# Patient Record
Sex: Female | Born: 1985 | Race: Black or African American | Hispanic: No | Marital: Single | Smoking: Never smoker
Health system: Southern US, Community
[De-identification: ages and names within clinical notes are randomized; demographics above are authoritative.]

## PROBLEM LIST (undated history)

## (undated) DIAGNOSIS — F32A Depression, unspecified: Secondary | ICD-10-CM

## (undated) DIAGNOSIS — F419 Anxiety disorder, unspecified: Secondary | ICD-10-CM

## (undated) HISTORY — DX: Anxiety disorder, unspecified: F41.9

## (undated) HISTORY — DX: Depression, unspecified: F32.A

---

## 2020-06-16 ENCOUNTER — Encounter: Payer: Self-pay | Admitting: Internal Medicine

## 2020-06-25 ENCOUNTER — Encounter: Payer: Self-pay | Admitting: Internal Medicine

## 2020-06-28 ENCOUNTER — Ambulatory Visit (INDEPENDENT_AMBULATORY_CARE_PROVIDER_SITE_OTHER): Payer: Medicaid Other | Admitting: *Deleted

## 2020-06-28 ENCOUNTER — Other Ambulatory Visit: Payer: Self-pay

## 2020-06-28 VITALS — BP 112/72 | HR 91 | Temp 97.8°F | Ht 59.0 in | Wt 112.4 lb

## 2020-06-28 DIAGNOSIS — Z3201 Encounter for pregnancy test, result positive: Secondary | ICD-10-CM

## 2020-06-28 DIAGNOSIS — O34219 Maternal care for unspecified type scar from previous cesarean delivery: Secondary | ICD-10-CM

## 2020-06-28 DIAGNOSIS — O0990 Supervision of high risk pregnancy, unspecified, unspecified trimester: Secondary | ICD-10-CM

## 2020-06-28 DIAGNOSIS — Z8751 Personal history of pre-term labor: Secondary | ICD-10-CM

## 2020-06-28 DIAGNOSIS — Z8759 Personal history of other complications of pregnancy, childbirth and the puerperium: Secondary | ICD-10-CM

## 2020-06-28 LAB — POCT URINE PREGNANCY: Preg Test, Ur: POSITIVE — AB

## 2020-06-28 MED ORDER — GOJJI WEIGHT SCALE MISC: 1.0000 | Freq: Every day | 0 refills | Status: DC | PRN

## 2020-06-28 MED ORDER — BLOOD PRESSURE MONITOR AUTOMAT DEVI
1.0000 | Freq: Every day | 0 refills | Status: DC
Start: 2020-06-28 — End: 2020-11-01

## 2020-06-28 NOTE — Progress Notes (Signed)
   Location: [facility] Patient: clinic Provider: clinic  PRENATAL INTAKE SUMMARY  Ms. Sui presents today New OB Nurse Interview.  OB History    Gravida  4   Para  3   Term      Preterm  2   AB  0   Living  1     SAB  0   IAB      Ectopic      Multiple      Live Births  1          I have reviewed the patient's medical, obstetrical, social, and family histories, medications, and available lab results.  SUBJECTIVE She has no unusual complaints.  Patient has history of stillbirth via vaginal delivery.  History of preterm delivery @ [redacted] weeks gestation via cesarean.  OBJECTIVE Initial Nurse interview for history/labs (New OB).   Late to Care  EDD: 12/14/2020 by LMP GA: [redacted]w[redacted]d [id number] FHT: 150  GENERAL APPEARANCE: alert, well appearing, in no apparent distress, oriented to person, place and time   ASSESSMENT Positive UPT Normal pregnancy  PLAN Prenatal care: [facility]  OB Pnl/HIV/Hep C OB Urine Culture GC/CT/PAP at next visit with Dr. Debroah Loop HgbEval/SMA/CF ([facility]) APF Panorama A1C Rx for blood pressure monitor and weight scale sent to [facility] Patient to sign up for Babyscripts   Follow Up Instructions:   I discussed the assessment and treatment plan with the patient. The patient was provided an opportunity to ask questions and all were answered. The patient agreed with the plan and demonstrated an understanding of the instructions.   The patient was advised to call back or seek an in-person evaluation if the symptoms worsen or if the condition fails to improve as anticipated.  I provided 40 minutes of  face-to-face time during this encounter.  Clovis Pu, RN

## 2020-06-28 NOTE — Patient Instructions (Addendum)
Genetic Screening Results Information: You are having genetic testing called Panorama today.  It will take approximately 2 weeks before the results are available.  To get your results, you need Internet access to a web browser to search [place]/MyChart (the direct app on your phone will not give you these results).  Then select Lab Scanned and click on the blue hyper link that says View Image to see your Panorama results.  You can also use the directions on the purple card given to look up your results directly on the [place] website.  Second Trimester of Pregnancy  The second trimester of pregnancy is from week 13 through week 27. This is also called months 4 through 6 of pregnancy. This is often the time when you feel your best. During the second trimester:  Morning sickness is less or has stopped.  You may have more energy.  You may feel hungry more often. At this time, your unborn baby (fetus) is growing very fast. At the end of the sixth month, the unborn baby may be up to 12 inches long and weigh about 1 pounds. You will likely start to feel the baby move between 16 and 20 weeks of pregnancy. Body changes during your second trimester Your body continues to go through many changes during this time. The changes vary and generally return to normal after the baby is born. Physical changes  You will gain more weight.  You may start to get stretch marks on your hips, belly (abdomen), and breasts.  Your breasts will grow and may hurt.  Dark spots or blotches may develop on your face.  A dark line from your belly button to the pubic area (linea nigra) may appear.  You may have changes in your hair. Health changes  You may have headaches.  You may have heartburn.  You may have trouble pooping (constipation).  You may have hemorrhoids or swollen, bulging veins (varicose veins).  Your gums may bleed.  You may pee (urinate) more often.  You may have back pain. Follow  these instructions at home: Medicines  Take over-the-counter and prescription medicines only as told by your doctor. Some medicines are not safe during pregnancy.  Take a prenatal vitamin that contains at least 600 micrograms (mcg) of folic acid. Eating and drinking  Eat healthy meals that include: ? Fresh fruits and vegetables. ? Whole grains. ? Good sources of protein, such as meat, eggs, or tofu. ? Low-fat dairy products.  Avoid raw meat and unpasteurized juice, milk, and cheese.  You may need to take these actions to prevent or treat trouble pooping: ? Drink enough fluids to keep your pee (urine) pale yellow. ? Eat foods that are high in fiber. These include beans, whole grains, and fresh fruits and vegetables. ? Limit foods that are high in fat and sugar. These include fried or sweet foods. Activity  Exercise only as told by your doctor. Most people can do their usual exercise during pregnancy. Try to exercise for 30 minutes at least 5 days a week.  Stop exercising if you have pain or cramps in your belly or lower back.  Do not exercise if it is too hot or too humid, or if you are in a place of great height (high altitude).  Avoid heavy lifting.  If you choose to, you may have sex unless your doctor tells you not to. Relieving pain and discomfort  Wear a good support bra if your breasts are sore.  Take warm  water baths (sitz baths) to soothe pain or discomfort caused by hemorrhoids. Use hemorrhoid cream if your doctor approves.  Rest with your legs raised (elevated) if you have leg cramps or low back pain.  If you develop bulging veins in your legs: ? Wear support hose as told by your doctor. ? Raise your feet for 15 minutes, 3-4 times a day. ? Limit salt in your food. Safety  Wear your seat belt at all times when you are in a car.  Talk with your doctor if someone is hurting you or yelling at you a lot. Lifestyle  Do not use hot tubs, steam rooms, or  saunas.  Do not douche. Do not use tampons or scented sanitary pads.  Avoid cat litter boxes and soil used by cats. These carry germs that can harm your baby and can cause a loss of your baby by miscarriage or stillbirth.  Do not use herbal medicines, illegal drugs, or medicines that are not approved by your doctor. Do not drink alcohol.  Do not smoke or use any products that contain nicotine or tobacco. If you need help quitting, ask your doctor. General instructions  Keep all follow-up visits. This is important.  Ask your doctor about local prenatal classes.  Ask your doctor about the right foods to eat or for help finding a counselor. Where to find more information  American Pregnancy Association: americanpregnancy.org  [employer]: www.acog.org  Office on [employer] Health: MightyReward.co.nz Contact a doctor if:  You have a headache that does not go away when you take medicine.  You have changes in how you see, or you see spots in front of your eyes.  You have mild cramps, pressure, or pain in your lower belly.  You continue to feel like you may vomit (nauseous), you vomit, or you have watery poop (diarrhea).  You have bad-smelling fluid coming from your vagina.  You have pain when you pee or your pee smells bad.  You have very bad swelling of your face, hands, ankles, feet, or legs.  You have a fever. Get help right away if:  You are leaking fluid from your vagina.  You have spotting or bleeding from your vagina.  You have very bad belly cramping or pain.  You have trouble breathing.  You have chest pain.  You faint.  You have not felt your baby move for the time period told by your doctor.  You have new or increased pain, swelling, or redness in an arm or leg. Summary  The second trimester of pregnancy is from week 13 through week 27 (months 4 through 6).  Eat healthy meals.  Exercise as told by your  doctor. Most people can do their usual exercise during pregnancy.  Do not use herbal medicines, illegal drugs, or medicines that are not approved by your doctor. Do not drink alcohol.  Call your doctor if you get sick or if you notice anything unusual about your pregnancy. This information is not intended to replace advice given to you by your health care provider. Make sure you discuss any questions you have with your health care provider. Document Revised: 09/10/2019 Document Reviewed: 07/17/2019 Elsevier Patient Education  2021 Elsevier Inc.  Warning Signs During Pregnancy During pregnancy, your body goes through many changes. Some changes may be uncomfortable, but most do not represent a serious problem. However, it is important to learn when certain signs and symptoms may indicate a problem. Talk with your health care  provider about your current health and any medical conditions you have. Make sure you know the symptoms to watch for and report. How does this affect me? Warning signs during pregnancy Let your health care provider know if you have any of the following warning signs:  Dizziness or feeling faint.  Nausea, vomiting, or diarrhea that lasts 24 hours or longer.  Spotting or bleeding from your vagina.  Abdominal cramping or pain in your pelvis or lower back.  Shortness of breath, difficulty breathing, or chest pain.  New or increased pain, swelling, or redness in an arm or leg.  Your baby is moving less than usual or is not moving. You should also watch for signs of a serious medical condition called preeclampsia. This may include:  A severe, throbbing headache that does not go away.  Vision changes, such as blurred or double vision, light sensitivity, or seeing spots in front of your eyes.  Sudden or extreme swelling of your face, hands, legs, or feet. Pregnancy causes changes that may make it more likely for you to get an infection. Let your health care provider  know if you have signs of infection, such as:  A fever.  A bad-smelling vaginal discharge.  Pain or burning when you urinate. How does this affect my baby? Throughout your pregnancy, always report any of the warning signs of a problem to your health care provider. This can help prevent complications that may affect your baby, including:  Increased risk for premature birth.  Infection that may be transmitted to your baby.  Increased risk for stillbirth. Follow these instructions at home:  Take over-the-counter and prescription medicines only as told by your health care provider.  Keep all follow-up visits. This is important.   Where to find more information  Office on Women's Health: [employer]ity[employer]cl[employer].cz  [employer]: EmploymentAssurance.cz Contact a health care provider if:  You have any warning signs of problems during your pregnancy.  Any of the following apply to you during your pregnancy: ? You have strong emotions, such as sadness or anxiety, that interfere with work or personal relationships. ? You feel unsafe in your home. ? You are using tobacco products, alcohol, or drugs, and you need help to stop. Get help right away if:  You have signs or symptoms of labor before 37 weeks of pregnancy. These include: ? Contractions that are 5 minutes or less apart, or that increase in frequency, intensity, or length. ? Sudden, sharp abdominal pain or low back pain. ? Uncontrolled gush or trickle of fluid from your vagina. Summary  Always report any warning signs to your health care provider to prevent complications that may affect both you and your baby.  Talk with your health care provider about your current health and any medical conditions you have. Make sure you know the symptoms to watch for and report.  Keep all follow-up visits. This is important. This information is not intended to replace advice given to you by  your health care provider. Make sure you discuss any questions you have with your health care provider. Document Revised: 09/10/2019 Document Reviewed: 08/08/2019 Elsevier Patient Education  2021 Elsevier Inc.  How to Take Your Blood Pressure Blood pressure measures how strongly your blood is pressing against the walls of your arteries. Arteries are blood vessels that carry blood from your heart throughout your body. You can take your blood pressure at home with a machine. You may need to check your blood pressure at home:  To check if you have high blood pressure (hypertension).  To check your blood pressure over time.  To make sure your blood pressure medicine is working. Supplies needed:  Blood pressure machine, or monitor.  Dining room chair to sit in.  Table or desk.  Small notebook.  Pencil or pen. How to prepare Avoid these things for 30 minutes before checking your blood pressure:  Having drinks with caffeine in them, such as coffee or tea.  Drinking alcohol.  Eating.  Smoking.  Exercising. Do these things five minutes before checking your blood pressure:  Go to the bathroom and pee (urinate).  Sit in a dining chair. Do not sit in a soft couch or an armchair.  Be quiet. Do not talk. How to take your blood pressure Follow the instructions that came with your machine. If you have a digital blood pressure monitor, these may be the instructions: 1. Sit up straight. 2. Place your feet on the floor. Do not cross your ankles or legs. 3. Rest your left arm at the level of your heart. You may rest it on a table, desk, or chair. 4. Pull up your shirt sleeve. 5. Wrap the blood pressure cuff around the upper part of your left arm. The cuff should be 1 inch (2.5 cm) above your elbow. It is best to wrap the cuff around bare skin. 6. Fit the cuff snugly around your arm. You should be able to place only one finger between the cuff and your arm. 7. Place the cord so that it  rests in the bend of your elbow. 8. Press the power button. 9. Sit quietly while the cuff fills with air and loses air. 10. Write down the numbers on the screen. 11. Wait 2-3 minutes and then repeat steps 1-10.   What do the numbers mean? Two numbers make up your blood pressure. The first number is called systolic pressure. The second is called diastolic pressure. An example of a blood pressure reading is "120 over 80" (or 120/80). If you are an adult and do not have a medical condition, use this guide to find out if your blood pressure is normal: Normal  First number: below 120.  Second number: below 80. Elevated  First number: 120-129.  Second number: below 80. Hypertension stage 1  First number: 130-139.  Second number: 80-89. Hypertension stage 2  First number: 140 or above.  Second number: 90 or above. Your blood pressure is above normal even if only the top or bottom number is above normal. Follow these instructions at home:  Check your blood pressure as often as your doctor tells you to.  Check your blood pressure at the same time every day.  Take your monitor to your next doctor's appointment. Your doctor will: ? Make sure you are using it correctly. ? Make sure it is working right.  Make sure you understand what your blood pressure numbers should be.  Tell your doctor if your medicine is causing side effects.  Keep all follow-up visits as told by your doctor. This is important. General tips:  You will need a blood pressure machine, or monitor. Your doctor can suggest a monitor. You can buy one at a drugstore or online. When choosing one: ? Choose one with an arm cuff. ? Choose one that wraps around your upper arm. Only one finger should fit between your arm and the cuff. ? Do not choose one that measures your blood pressure from your wrist or finger. Where  to find more information American Heart Association: www.heart.org Contact a doctor if:  Your blood  pressure keeps being high. Get help right away if:  Your first blood pressure number is higher than 180.  Your second blood pressure number is higher than 120. Summary  Check your blood pressure at the same time every day.  Avoid caffeine, alcohol, smoking, and exercise for 30 minutes before checking your blood pressure.  Make sure you understand what your blood pressure numbers should be. This information is not intended to replace advice given to you by your health care provider. Make sure you discuss any questions you have with your health care provider. Document Revised: 03/28/2019 Document Reviewed: 03/28/2019 Elsevier Patient Education  2021 Elsevier Inc.  Genetic Testing During Pregnancy Why is genetic testing done? Genetic testing during pregnancy is also called prenatal genetic testing. This type of testing can determine if your baby is at risk of being born with a disorder caused by abnormal genes or chromosomes (genetic disorder). Chromosomes contain genes that control how your baby will develop in your womb. There are many different genetic disorders. Examples of genetic disorders that may be found through genetic testing include Down syndrome and cystic fibrosis. Gene changes (mutations) can be passed down through families. Genetic testing is offered to women during pregnancy. You can choose whether to have genetic testing. Having genetic testing allows you to:  Discuss your test results and options with your health care provider.  Prepare for a baby that may be born with a genetic disorder. Learning about the disorder ahead of time helps you be better prepared to manage it. Your health care providers can also be prepared in case your baby requires special care before or after birth.  Consider whether you want to continue with the pregnancy. In some cases, genetic testing may be done to learn about the traits a child will inherit. Types of genetic tests There are two basic  types of genetic testing. Screening tests indicate whether your developing baby (fetus) is at higher risk for a genetic disorder. Diagnostic tests check actual fetal cells to diagnose a genetic disorder. Screening tests Screening tests will not harm your baby. They are recommended for all pregnant women. Types of screening tests include:  Carrier screening. This test involves checking genes from both parents by testing their blood or saliva. The test checks to find out if the parents carry a genetic mutation that may be passed to a baby. In most cases, both parents must carry the mutation for a baby to be at risk.  First trimester screening. This test combines a blood test with sound wave imaging of your baby (fetal ultrasound). This screening test checks for a risk of Down syndrome or other defects caused by having extra chromosomes. The ultrasound also checks for defects of the heart, abdomen, or skeleton.  Second trimester screening also combines a blood test with a fetal ultrasound exam. This test checks for a risk of Down syndrome or other defects caused by having extra chromosomes. The ultrasound allows your health care provider to look for genetic defects of the face, brain, spine, heart, or limbs. Some women may choose to only have an ultrasound exam without a blood test.  Combined or sequential screening. This type of testing combines the results of first and second trimester screening. This type of testing may be more accurate than first or second trimester screening alone.  Cell-free DNA testing. This is a blood test that detects cells released by  the placenta that get into the mother's blood. It can be used to check for a risk of Down syndrome, other extra chromosome syndromes, and disorders caused by abnormal numbers of sex chromosomes. This test can be done any time after 10 weeks of pregnancy.      Diagnostic tests Diagnostic tests carry slight risks of problems, including bleeding,  infection, and loss of the pregnancy. These tests are done only if your baby is at risk for a genetic disorder. Your health care provider will discuss the risks and benefits of having diagnostic tests before performing these types of tests. Examples of diagnostic tests include:  Chorionic villus sampling (CVS). This involves a procedure to remove and test a sample of cells taken from the placenta. The procedure may be done between 10 and 12 weeks of pregnancy.  Amniocentesis. This involves a procedure to remove and test a sample of fluid (amniotic fluid) and cells from the sac that surrounds the developing baby. The procedure may be done any time during the pregnancy, but it is usually done between 15 and 20 weeks of pregnancy. What do the results mean? For a screening test:  If the results are negative, it often means that your child is not at higher risk. There is still a slight chance your child could have a genetic disorder.  If the results are positive, it does not mean your child will have a genetic disorder. It may mean that your child has a higher-than-normal risk for a genetic disorder. In that case, you should talk with your health care provider about whether you should have diagnostic genetic tests. For a diagnostic test:  If the result is negative, it is unlikely that your child will have a genetic disorder.  If the test is positive for a genetic disorder, it is likely that your child will have the disorder. The test may not tell how severe the disorder will be. Talk with your health care provider about your options. Talk with your health care provider about what your results mean. Questions to ask your health care provider Before talking to your health care provider about genetic testing, find out if there is a history of genetic disorders in your family. It may also help to know your family's ethnic origins. Then ask your health care provider the following questions:  Is my baby at  risk for a genetic disorder?  What are the benefits of having genetic screening?  What tests are best for me and my baby?  What are the risks of each test?  If I get a positive result on a screening test, what is the next step?  Should I meet with a genetic counselor?  Should my partner or other members of my family be tested?  How much do the tests cost? Will my insurance cover the testing? Summary  Genetic testing is done during pregnancy to find out whether your child is at risk for a genetic disorder.  Genetic testing is offered to women during pregnancy. You can choose whether to have genetic testing.  There are two basic types of genetic testing. Screening tests indicate whether your developing baby (fetus) is at higher risk for a genetic disorder. Diagnostic tests check actual fetal cells to diagnose a genetic disorder.  If a diagnostic genetic test is positive, talk with your health care provider about your options. This information is not intended to replace advice given to you by your health care provider. Make sure you discuss any questions you  have with your health care provider. Document Revised: 10/24/2019 Document Reviewed: 10/24/2019 Elsevier Patient Education  2021 [employer].

## 2020-06-30 LAB — CBC/D/PLT+RPR+RH+ABO+RUB AB...
Antibody Screen: NEGATIVE
Basophils Absolute: 0 10*3/uL (ref 0.0–0.2)
Basos: 1 %
EOS (ABSOLUTE): 0.1 10*3/uL (ref 0.0–0.4)
Eos: 1 %
HCV Ab: <0.1 s/co ratio (ref 0.0–0.9)
HIV Screen 4th Generation wRfx: NONREACTIVE
Hematocrit: 37.1 % (ref 34.0–46.6)
Hemoglobin: 12 g/dL (ref 11.1–15.9)
Hepatitis B Surface Ag: NEGATIVE
Immature Grans (Abs): 0 10*3/uL (ref 0.0–0.1)
Immature Granulocytes: 0 %
Lymphocytes Absolute: 1.8 10*3/uL (ref 0.7–3.1)
Lymphs: 29 %
MCH: 24.9 pg — ABNORMAL LOW (ref 26.6–33.0)
MCHC: 32.3 g/dL (ref 31.5–35.7)
MCV: 77 fL — ABNORMAL LOW (ref 79–97)
Monocytes Absolute: 0.5 10*3/uL (ref 0.1–0.9)
Monocytes: 7 %
Neutrophils Absolute: 3.8 10*3/uL (ref 1.4–7.0)
Neutrophils: 62 %
Platelets: 286 10*3/uL (ref 150–450)
RBC: 4.82 x10E6/uL (ref 3.77–5.28)
RDW: 15 % (ref 11.7–15.4)
RPR Ser Ql: NONREACTIVE
Rh Factor: POSITIVE
Rubella Antibodies, IGG: 1.86 index (ref >0.99)
WBC: 6.2 10*3/uL (ref 3.4–10.8)

## 2020-06-30 LAB — AFP, SERUM, OPEN SPINA BIFIDA
AFP MoM: 1.18
AFP Value: 47.1 ng/mL
Gest. Age on Collection Date: 15 weeks
Maternal Age At EDD: 35.4 yr
OSBR Risk 1 IN: 10000
Test Results:: NEGATIVE
Weight: 112 [lb_av]

## 2020-06-30 LAB — HCV INTERPRETATION

## 2020-06-30 LAB — HEMOGLOBIN A1C
Est. average glucose Bld gHb Est-mCnc: 88 mg/dL
Hgb A1c MFr Bld: 4.7 % — ABNORMAL LOW (ref 4.8–5.6)

## 2020-07-01 LAB — URINE CULTURE, OB REFLEX

## 2020-07-01 LAB — CULTURE, OB URINE

## 2020-07-05 ENCOUNTER — Encounter: Payer: Self-pay | Admitting: *Deleted

## 2020-07-14 ENCOUNTER — Ambulatory Visit (INDEPENDENT_AMBULATORY_CARE_PROVIDER_SITE_OTHER): Payer: Medicaid Other | Admitting: Obstetrics & Gynecology

## 2020-07-14 ENCOUNTER — Other Ambulatory Visit (HOSPITAL_COMMUNITY)
Admission: RE | Admit: 2020-07-14 | Discharge: 2020-07-14 | Disposition: A | Discharge status: Home / Self Care | Payer: Medicaid Other | Source: Ambulatory Visit | Attending: Obstetrics & Gynecology | Admitting: Obstetrics & Gynecology

## 2020-07-14 ENCOUNTER — Encounter: Payer: Medicaid Other | Admitting: Certified Nurse Midwife

## 2020-07-14 ENCOUNTER — Other Ambulatory Visit: Payer: Self-pay

## 2020-07-14 ENCOUNTER — Encounter: Payer: Self-pay | Admitting: Obstetrics & Gynecology

## 2020-07-14 VITALS — BP 100/66 | HR 78 | Wt 115.0 lb

## 2020-07-14 DIAGNOSIS — Z3A18 18 weeks gestation of pregnancy: Secondary | ICD-10-CM

## 2020-07-14 DIAGNOSIS — Z8751 Personal history of pre-term labor: Secondary | ICD-10-CM

## 2020-07-14 DIAGNOSIS — O0990 Supervision of high risk pregnancy, unspecified, unspecified trimester: Secondary | ICD-10-CM | POA: Diagnosis not present

## 2020-07-14 DIAGNOSIS — Z3482 Encounter for supervision of other normal pregnancy, second trimester: Secondary | ICD-10-CM | POA: Diagnosis not present

## 2020-07-14 DIAGNOSIS — O34219 Maternal care for unspecified type scar from previous cesarean delivery: Secondary | ICD-10-CM

## 2020-07-14 DIAGNOSIS — Z8759 Personal history of other complications of pregnancy, childbirth and the puerperium: Secondary | ICD-10-CM

## 2020-07-14 NOTE — Progress Notes (Signed)
  Subjective:Intake done at [facility] 3/14    Allison Boone is a [id number] [redacted]w[redacted]d being seen today for her first obstetrical visit.  Her obstetrical history is significant for h/o PTB, abruption, stillbirth, C/S. Patient does intend to breast feed. Pregnancy history fully reviewed.  Patient reports headache.  Vitals:   07/14/20 0841  BP: 100/66  Pulse: 78  Weight: 115 lb (52.2 kg)    HISTORY: OB History  Gravida Para Term Preterm AB Living  4 3   3  0 1  SAB IAB Ectopic Multiple Live Births  0       1    # Outcome Date GA Lbr Len/2nd Weight Sex Delivery Anes PTL Lv  4 Current           3 Preterm 2019 [redacted]w[redacted]d    Vag-Spont   FD  2 Preterm 2010 [redacted]w[redacted]d    Vag-Spont   FD  1 Preterm 03/02/05 [redacted]w[redacted]d  3 lb 7 oz (1.559 kg) M CS-LTranv  Y LIV   Past Medical History:  Diagnosis Date  . Anxiety    Phreesia 06/22/2020  . Depression    [employer]reesia 06/22/2020   Past Surgical History:  Procedure Laterality Date  . CESAREAN SECTION N/A    [employer]reesia 06/22/2020   No family history on file.   Exam    Uterus:     Pelvic Exam:    Perineum: No Hemorrhoids   Vulva: normal   Vagina:  normal mucosa   pH:     Cervix: no lesions   Adnexa: not evaluated   Bony Pelvis: average  System: Breast:  normal appearance, no masses or tenderness   Skin: normal coloration and turgor, no rashes    Neurologic: oriented, normal mood   Extremities: normal strength, tone, and muscle mass   HEENT neck supple with midline trachea and thyroid without masses   Mouth/Teeth mucous membranes moist, pharynx normal without lesions and dental hygiene good   Neck supple and no masses   Cardiovascular: regular rate and rhythm, no murmurs or gallops   Respiratory:  appears well, vitals normal, no respiratory distress, acyanotic, normal RR, neck free of mass or lymphadenopathy, chest clear, no wheezing, crepitations, rhonchi, normal symmetric air entry   Abdomen: gravid   Urinary: urethral meatus normal       Assessment:    Pregnancy: 08/22/2020 Patient Active Problem List   Diagnosis Date Noted  . Supervision of high risk pregnancy, antepartum 06/28/2020  . History of stillbirth 06/28/2020  . History of cesarean delivery, currently pregnant 06/28/2020  . History of preterm delivery 06/28/2020        Plan:     Initial labs drawn. Prenatal vitamins. Problem list reviewed and updated. Genetic Screening results reviewed  Ultrasound discussed; fetal survey: ordered.  Follow up in 4 weeks. 50% of 30 min visit spent on counseling and coordination of care.  MFM consult requested. Need record from [facility]   06/30/2020 07/14/2020

## 2020-07-14 NOTE — Patient Instructions (Signed)

## 2020-07-14 NOTE — Progress Notes (Signed)
Pt presents today for initial prenatal visit. Pt had intake visit at another facility with bloodwork and genetic screening. She will need pap and cultures today. Pt has no additional complaints.

## 2020-07-15 LAB — CERVICOVAGINAL ANCILLARY ONLY
Chlamydia: NEGATIVE
Comment: NEGATIVE
Comment: NEGATIVE
Comment: NORMAL
Neisseria Gonorrhea: NEGATIVE
Trichomonas: NEGATIVE

## 2020-07-15 LAB — CYTOLOGY - PAP
Adequacy: ABSENT
Comment: NEGATIVE
Diagnosis: NEGATIVE
High risk HPV: NEGATIVE

## 2020-08-05 ENCOUNTER — Other Ambulatory Visit: Payer: Self-pay | Admitting: *Deleted

## 2020-08-05 ENCOUNTER — Ambulatory Visit: Payer: Medicaid Other | Attending: Obstetrics & Gynecology

## 2020-08-05 ENCOUNTER — Other Ambulatory Visit: Payer: Self-pay

## 2020-08-05 ENCOUNTER — Encounter: Payer: Self-pay | Admitting: *Deleted

## 2020-08-05 ENCOUNTER — Ambulatory Visit: Payer: Medicaid Other | Admitting: *Deleted

## 2020-08-05 DIAGNOSIS — Z8751 Personal history of pre-term labor: Secondary | ICD-10-CM | POA: Insufficient documentation

## 2020-08-05 DIAGNOSIS — O34219 Maternal care for unspecified type scar from previous cesarean delivery: Secondary | ICD-10-CM | POA: Diagnosis present

## 2020-08-05 DIAGNOSIS — Z8759 Personal history of other complications of pregnancy, childbirth and the puerperium: Secondary | ICD-10-CM

## 2020-08-05 DIAGNOSIS — O0990 Supervision of high risk pregnancy, unspecified, unspecified trimester: Secondary | ICD-10-CM

## 2020-08-11 ENCOUNTER — Ambulatory Visit (INDEPENDENT_AMBULATORY_CARE_PROVIDER_SITE_OTHER): Payer: Medicaid Other | Admitting: Obstetrics and Gynecology

## 2020-08-11 ENCOUNTER — Encounter: Payer: Self-pay | Admitting: Obstetrics and Gynecology

## 2020-08-11 ENCOUNTER — Other Ambulatory Visit: Payer: Self-pay

## 2020-08-11 VITALS — BP 97/66 | HR 92 | Wt 122.0 lb

## 2020-08-11 DIAGNOSIS — O34219 Maternal care for unspecified type scar from previous cesarean delivery: Secondary | ICD-10-CM

## 2020-08-11 DIAGNOSIS — O0991 Supervision of high risk pregnancy, unspecified, first trimester: Secondary | ICD-10-CM

## 2020-08-11 DIAGNOSIS — Z8759 Personal history of other complications of pregnancy, childbirth and the puerperium: Secondary | ICD-10-CM

## 2020-08-11 DIAGNOSIS — Z8751 Personal history of pre-term labor: Secondary | ICD-10-CM

## 2020-08-11 DIAGNOSIS — O0990 Supervision of high risk pregnancy, unspecified, unspecified trimester: Secondary | ICD-10-CM

## 2020-08-11 NOTE — Progress Notes (Signed)
ROB [redacted]w[redacted]d  AFP WNL    CC: None

## 2020-08-11 NOTE — Progress Notes (Signed)
Subjective:  Allison Boone is a 35 y.o. [phone] at [redacted]w[redacted]d being seen today for ongoing prenatal care.  She is currently monitored for the following issues for this high-risk pregnancy and has Supervision of high risk pregnancy, antepartum; History of stillbirth; History of cesarean delivery, currently pregnant; and History of preterm delivery on their problem list.  Patient reports no complaints.  Contractions: Not present. Vag. Bleeding: None.  Movement: Present. Denies leaking of fluid.   The following portions of the patient's history were reviewed and updated as appropriate: allergies, current medications, past family history, past medical history, past social history, past surgical history and problem list. Problem list updated.  Objective:   Vitals:   08/11/20 0923  BP: 97/66  Pulse: 92  Weight: 122 lb (55.3 kg)    Fetal Status: Fetal Heart Rate (bpm): 152   Movement: Present     General:  Alert, oriented and cooperative. Patient is in no acute distress.  Skin: Skin is warm and dry. No rash noted.   Cardiovascular: Normal heart rate noted  Respiratory: Normal respiratory effort, no problems with respiration noted  Abdomen: Soft, gravid, appropriate for gestational age. Pain/Pressure: Absent     Pelvic:  Cervical exam deferred        Extremities: Normal range of motion.  Edema: None  Mental Status: Normal mood and affect. Normal behavior. Normal judgment and thought content.   Urinalysis:      Assessment and Plan:  Pregnancy: G4P0301 at 110w1d  1. Supervision of high risk pregnancy, antepartum Stable Glucola next visit  2. History of stillbirth Unknown cause Serial growth scans and antenatal testing starting at 28 weeks per MFM. Reviewed with pt  3. History of preterm delivery Stable  4. History of cesarean delivery, currently pregnant Has had 2 VBAC at preterm. Will discuss TOLAC at later visits  Preterm labor symptoms and general obstetric precautions including  but not limited to vaginal bleeding, contractions, leaking of fluid and fetal movement were reviewed in detail with the patient. Please refer to After Visit Summary for other counseling recommendations.  Return in about 4 weeks (around 09/08/2020) for OB visit, face to face, MD only, fasting for Glucola.   Hermina Staggers, MD

## 2020-08-11 NOTE — Patient Instructions (Signed)

## 2020-08-14 ENCOUNTER — Other Ambulatory Visit: Payer: Self-pay

## 2020-08-14 ENCOUNTER — Inpatient Hospital Stay (HOSPITAL_COMMUNITY)
Admission: AD | Admit: 2020-08-14 | Discharge: 2020-08-14 | Disposition: A | Discharge status: Home / Self Care | Payer: Medicaid Other | Attending: Obstetrics and Gynecology | Admitting: Obstetrics and Gynecology

## 2020-08-14 ENCOUNTER — Inpatient Hospital Stay (HOSPITAL_BASED_OUTPATIENT_CLINIC_OR_DEPARTMENT_OTHER): Payer: Medicaid Other

## 2020-08-14 ENCOUNTER — Encounter (HOSPITAL_COMMUNITY): Payer: Self-pay | Admitting: Obstetrics and Gynecology

## 2020-08-14 DIAGNOSIS — Z79899 Other long term (current) drug therapy: Secondary | ICD-10-CM | POA: Insufficient documentation

## 2020-08-14 DIAGNOSIS — R109 Unspecified abdominal pain: Secondary | ICD-10-CM

## 2020-08-14 DIAGNOSIS — Z3A22 22 weeks gestation of pregnancy: Secondary | ICD-10-CM

## 2020-08-14 DIAGNOSIS — O09292 Supervision of pregnancy with other poor reproductive or obstetric history, second trimester: Secondary | ICD-10-CM | POA: Diagnosis not present

## 2020-08-14 DIAGNOSIS — O09212 Supervision of pregnancy with history of pre-term labor, second trimester: Secondary | ICD-10-CM

## 2020-08-14 DIAGNOSIS — O09892 Supervision of other high risk pregnancies, second trimester: Secondary | ICD-10-CM

## 2020-08-14 DIAGNOSIS — O26892 Other specified pregnancy related conditions, second trimester: Secondary | ICD-10-CM

## 2020-08-14 DIAGNOSIS — O4702 False labor before 37 completed weeks of gestation, second trimester: Secondary | ICD-10-CM | POA: Insufficient documentation

## 2020-08-14 DIAGNOSIS — O09522 Supervision of elderly multigravida, second trimester: Secondary | ICD-10-CM | POA: Diagnosis not present

## 2020-08-14 DIAGNOSIS — Z8751 Personal history of pre-term labor: Secondary | ICD-10-CM | POA: Diagnosis not present

## 2020-08-14 LAB — URINALYSIS, ROUTINE W REFLEX MICROSCOPIC
Bilirubin Urine: NEGATIVE
Glucose, UA: NEGATIVE mg/dL
Hgb urine dipstick: NEGATIVE
Ketones, ur: NEGATIVE mg/dL
Leukocytes,Ua: NEGATIVE
Nitrite: NEGATIVE
Protein, ur: NEGATIVE mg/dL
Specific Gravity, Urine: 1.013 (ref 1.005–1.030)
pH: 9 — ABNORMAL HIGH (ref 5.0–8.0)

## 2020-08-14 NOTE — MAU Provider Note (Signed)
History     CSN: [id number]  Arrival date and time: 08/14/20 1304   Event Date/Time   First Provider Initiated Contact with Patient 08/14/20 1350      Chief Complaint  Patient presents with  . Abdominal Pain  . Contractions   HPI Allison Boone is a 35 y.o. [phone] at [redacted]w[redacted]d who presents with abdominal cramping. Symptoms started last night. Reports intermittent pressure that at times is painful. Doesn't occur more than 10 times per hour. Rates pain 3/10. Pain radiates to low back. Hasn't treated symptoms. Denies n/v/d, dysuria, vaginal bleeding, vaginal discharge, LOF, or recent intercourse. Has had 1 cup of juice today & no other fluids. Endorses fetal movement. History of 3 preterm deliveries (>29 wks) & 2 IUFDs (29 & 32 wks).   OB History    Gravida  4   Para  3   Term      Preterm  3   AB  0   Living  1     SAB  0   IAB      Ectopic      Multiple      Live Births  1           Past Medical History:  Diagnosis Date  . Anxiety    Phreesia 06/22/2020  . Depression    [employer]reesia 06/22/2020    Past Surgical History:  Procedure Laterality Date  . CESAREAN SECTION N/A    [employer]reesia 06/22/2020    Family History  Problem Relation Age of Onset  . Cancer Neg Hx   . Diabetes Neg Hx   . Heart disease Neg Hx   . Hypertension Neg Hx     Social History   Tobacco Use  . Smoking status: Never Smoker  . Smokeless tobacco: Never Used  Vaping Use  . Vaping Use: Never used  Substance Use Topics  . Alcohol use: Never  . Drug use: Never    Allergies: No Known Allergies  Medications Prior to Admission  Medication Sig Dispense Refill Last Dose  . Prenatal Vit-Fe Fumarate-FA (MULTIVITAMIN-PRENATAL) 27-0.8 MG TABS tablet Take 1 tablet by mouth daily at 12 noon.   08/13/2020 at Unknown time  . Blood Pressure Monitoring (BLOOD PRESSURE MONITOR AUTOMAT) DEVI 1 Device by Does not apply route daily. Automatic blood pressure cuff regular size. To monitor blood  pressure regularly at home. ICD-10 code:Z34.90 1 each 0   . Misc. Devices (GOJJI WEIGHT SCALE) MISC 1 Device by Does not apply route daily as needed. To weight self daily as needed at home. ICD-10 code: Z34.90 1 each 0     Review of Systems  Constitutional: Negative.   Gastrointestinal: Positive for abdominal pain. Negative for constipation, diarrhea, nausea and vomiting.  Genitourinary: Negative.   Musculoskeletal: Positive for back pain.   Physical Exam   Blood pressure (!) 92/55, pulse 78, temperature 98.2 F (36.8 C), temperature source Oral, resp. rate 16, height 4\' 11"  (1.499 m), weight 56.1 kg, last menstrual period 03/09/2020, SpO2 100 %.  Physical Exam Vitals and nursing note reviewed. Exam conducted with a chaperone present.  Constitutional:      General: She is not in acute distress.    Appearance: She is well-developed and normal weight.  HENT:     Head: Normocephalic and atraumatic.  Pulmonary:     Effort: Pulmonary effort is normal. No respiratory distress.  Abdominal:     Palpations: Abdomen is soft.     Tenderness: There is no abdominal tenderness.  Genitourinary:    Comments: Dilation: Closed Effacement (%): Thick Cervical Position: Posterior Station: -3 Exam by:: Judeth Horn NP  Skin:    General: Skin is warm and dry.  Neurological:     Mental Status: She is alert.  Psychiatric:        Mood and Affect: Mood normal.        Behavior: Behavior normal.     MAU Course  Procedures Results for orders placed or performed during the hospital encounter of 08/14/20 (from the past 24 hour(s))  Urinalysis, Routine w reflex microscopic Urine, Clean Catch     Status: Abnormal   Collection Time: 08/14/20  1:41 PM  Result Value Ref Range   Color, Urine YELLOW YELLOW   APPearance CLEAR CLEAR   Specific Gravity, Urine 1.013 1.005 - 1.030   pH 9.0 (H) 5.0 - 8.0   Glucose, UA NEGATIVE NEGATIVE mg/dL   Hgb urine dipstick NEGATIVE NEGATIVE   Bilirubin Urine  NEGATIVE NEGATIVE   Ketones, ur NEGATIVE NEGATIVE mg/dL   Protein, ur NEGATIVE NEGATIVE mg/dL   Nitrite NEGATIVE NEGATIVE   Leukocytes,Ua NEGATIVE NEGATIVE   No results found.  MDM FHT present via doppler Abdomen soft & non tender. Cervix closed. Transvaginal ultrasound ordered to assess cervical length given patient's history.  Cervix is 4 cm in length.  U/a negative   Assessment and Plan   1. Abdominal pain during pregnancy in second trimester   2. [redacted] weeks gestation of pregnancy   3. History of preterm delivery, currently pregnant in second trimester    -reviewed PTL precautions & reasons to return to MAU  Judeth Horn 08/14/2020, 4:18 PM

## 2020-08-14 NOTE — MAU Note (Signed)
Pt reports to mau with c/o lower back cramping and tightening of her abd since waking up this morning.  Pt denies vag bleeding or LOF.

## 2020-08-14 NOTE — Discharge Instructions (Signed)
Preterm Labor Pregnancy normally lasts 39-41 weeks. Preterm labor is when labor starts before you have been pregnant for 37 weeks. Babies who are born too early may have problems with blood sugar, body temperature, heart, and breathing. These problems may be very serious in babies who are born before 34 weeks of pregnancy. What are the causes? The cause of this condition is not known. What increases the risk? You are more likely to have preterm labor if:  You have medical problems, now or in the past.  You have problems now or in your past pregnancies.  You have lifestyle problems. Medical history  You have problems of the womb (uterus).  You have an infection, including infections you get from sex.  You have problems that do not go away, such as: ? Blood clots. ? High blood pressure. ? High blood sugar.  You have low body weight or too much body weight. Present and past pregnancies  You have had preterm labor before.  You are pregnant with two babies or more.  You have a condition in which the placenta covers your cervix.  You waited less than 6 months between giving birth and becoming pregnant again.  Your unborn baby has some problems.  You have bleeding from your vagina.  You became pregnant by a method called IVF. Lifestyle  You smoke.  You drink alcohol.  You use drugs.  You have stress.  You have abuse in your home.  You come in contact with chemicals that harm the body (pollutants). Other factors  You are younger than 17 years or older than 35 years. What are the signs or symptoms? Symptoms of this condition include:  Cramps. The cramps may feel like cramps from a period.  You may have watery poop (diarrhea).  Pain in the belly (abdomen).  Pain in the lower back.  Regular contractions. It may feel like your belly is getting tighter.  Pressure in the lower belly.  More fluid leaking from the vagina. The fluid may be watery or  bloody.  Water breaking. How is this treated? Treatment for this condition depends on your health, the health of your baby, and how old your pregnancy is. It may include:  Taking medicines, such as: ? Hormone medicines. ? Medicines to stop contractions. ? Medicines to help mature the baby's lungs. ? Medicines to prevent your baby from getting cerebral palsy.  Bed rest. If the labor happens before 34 weeks of pregnancy, you may need to stay in the hospital.  Delivering the baby. Follow these instructions at home:  Do not use any products that contain nicotine or tobacco, such as cigarettes, e-cigarettes, and chewing tobacco. If you need help quitting, ask your doctor.  Do not drink alcohol.  Take over-the-counter and prescription medicines only as told by your doctor.  Rest as told by your doctor.  Return to your activities as told by your doctor. Ask your doctor what activities are safe for you.  Keep all follow-up visits as told by your doctor. This is important.   How is this prevented? To have a healthy pregnancy:  Do not use street drugs.  Do not use any medicines unless you ask your doctor if they are safe for you.  Talk with your doctor before taking any herbal supplements.  Make sure you gain enough weight.  Watch for infection. If you think you might have an infection, get it checked right away. Symptoms of infection may include: ? Fever. ? Vaginal discharge. ?   Pain or burning when you pee. ? Needing to pee urgently. ? Needing to pee often. ? Peeing small amounts often. ? Blood in your pee. ? Pee that smells bad or unusual.  Tell your doctor if you have gone into preterm labor before. Contact a doctor if:  You think you are going into preterm labor.  You have symptoms of preterm labor.  You have symptoms of infection. Get help right away if:  You are having painful contractions every 5 minutes or less.  Your water breaks. Summary  Preterm labor  is labor that starts before you reach 37 weeks of pregnancy.  Your baby may have problems if delivered early.  The cause of preterm labor is not known. Having problems of the womb (uterus), an infection, or bleeding during pregnancy increases the risk.  Contact a doctor if you have signs or symptoms of preterm labor. This information is not intended to replace advice given to you by your health care provider. Make sure you discuss any questions you have with your health care provider. Document Revised: 05/06/2019 Document Reviewed: 05/06/2019 Elsevier Patient Education  2021 [employer]  

## 2020-08-14 NOTE — Progress Notes (Signed)
Upon initial assessment patient stated that she has been experiencing decreased fetal movement. Patient given kick/ fetal movement button and educated. Understanding was verbalized, patient agreed.

## 2020-09-02 ENCOUNTER — Other Ambulatory Visit: Payer: Self-pay

## 2020-09-02 ENCOUNTER — Ambulatory Visit: Payer: Medicaid Other | Attending: Obstetrics and Gynecology

## 2020-09-02 ENCOUNTER — Other Ambulatory Visit: Payer: Self-pay | Admitting: Obstetrics

## 2020-09-02 ENCOUNTER — Ambulatory Visit: Payer: Medicaid Other | Admitting: *Deleted

## 2020-09-02 ENCOUNTER — Encounter: Payer: Self-pay | Admitting: *Deleted

## 2020-09-02 DIAGNOSIS — Z3A25 25 weeks gestation of pregnancy: Secondary | ICD-10-CM

## 2020-09-02 DIAGNOSIS — Z8759 Personal history of other complications of pregnancy, childbirth and the puerperium: Secondary | ICD-10-CM | POA: Diagnosis not present

## 2020-09-02 DIAGNOSIS — Z8751 Personal history of pre-term labor: Secondary | ICD-10-CM

## 2020-09-02 DIAGNOSIS — O34219 Maternal care for unspecified type scar from previous cesarean delivery: Secondary | ICD-10-CM

## 2020-09-02 DIAGNOSIS — O0990 Supervision of high risk pregnancy, unspecified, unspecified trimester: Secondary | ICD-10-CM | POA: Diagnosis present

## 2020-09-02 DIAGNOSIS — O26892 Other specified pregnancy related conditions, second trimester: Secondary | ICD-10-CM

## 2020-09-02 DIAGNOSIS — Z362 Encounter for other antenatal screening follow-up: Secondary | ICD-10-CM

## 2020-09-02 DIAGNOSIS — O09212 Supervision of pregnancy with history of pre-term labor, second trimester: Secondary | ICD-10-CM

## 2020-09-02 DIAGNOSIS — O09292 Supervision of pregnancy with other poor reproductive or obstetric history, second trimester: Secondary | ICD-10-CM | POA: Diagnosis not present

## 2020-09-02 DIAGNOSIS — R109 Unspecified abdominal pain: Secondary | ICD-10-CM

## 2020-09-02 DIAGNOSIS — O09522 Supervision of elderly multigravida, second trimester: Secondary | ICD-10-CM | POA: Diagnosis not present

## 2020-09-08 ENCOUNTER — Encounter: Payer: Self-pay | Admitting: Obstetrics and Gynecology

## 2020-09-08 ENCOUNTER — Other Ambulatory Visit: Payer: Self-pay

## 2020-09-08 ENCOUNTER — Other Ambulatory Visit: Payer: Medicaid Other

## 2020-09-08 ENCOUNTER — Ambulatory Visit (INDEPENDENT_AMBULATORY_CARE_PROVIDER_SITE_OTHER): Payer: Medicaid Other | Admitting: Obstetrics and Gynecology

## 2020-09-08 DIAGNOSIS — O34219 Maternal care for unspecified type scar from previous cesarean delivery: Secondary | ICD-10-CM

## 2020-09-08 DIAGNOSIS — O0990 Supervision of high risk pregnancy, unspecified, unspecified trimester: Secondary | ICD-10-CM

## 2020-09-08 DIAGNOSIS — Z8751 Personal history of pre-term labor: Secondary | ICD-10-CM

## 2020-09-08 DIAGNOSIS — Z8759 Personal history of other complications of pregnancy, childbirth and the puerperium: Secondary | ICD-10-CM

## 2020-09-08 NOTE — Patient Instructions (Signed)

## 2020-09-08 NOTE — Progress Notes (Signed)
Subjective:  Allison Boone is a 35 y.o. [phone] at [redacted]w[redacted]d being seen today for ongoing prenatal care.  She is currently monitored for the following issues for this high-risk pregnancy and has Supervision of high risk pregnancy, antepartum; History of stillbirth; History of cesarean delivery, currently pregnant; and History of preterm delivery on their problem list.  Patient reports no complaints.  Contractions: Not present. Vag. Bleeding: None.  Movement: Present. Denies leaking of fluid.   The following portions of the patient's history were reviewed and updated as appropriate: allergies, current medications, past family history, past medical history, past social history, past surgical history and problem list. Problem list updated.  Objective:   Vitals:   09/08/20 0833  BP: 106/72  Pulse: 72  Weight: 130 lb (59 kg)    Fetal Status: Fetal Heart Rate (bpm): 150   Movement: Present     General:  Alert, oriented and cooperative. Patient is in no acute distress.  Skin: Skin is warm and dry. No rash noted.   Cardiovascular: Normal heart rate noted  Respiratory: Normal respiratory effort, no problems with respiration noted  Abdomen: Soft, gravid, appropriate for gestational age. Pain/Pressure: Absent     Pelvic:  Cervical exam deferred        Extremities: Normal range of motion.  Edema: None  Mental Status: Normal mood and affect. Normal behavior. Normal judgment and thought content.   Urinalysis:      Assessment and Plan:  Pregnancy: G4P0301 at [redacted]w[redacted]d  1. Supervision of high risk pregnancy, antepartum Stable Glucola today   2. History of stillbirth Growth scan 53 % Serial growth scans as per MFM  3. History of cesarean delivery, currently pregnant Prior VBAC Desires again. Consented today   4. History of preterm delivery Stable No S/Sx at present  5. Unwanted fertility BTL papers signed today   Preterm labor symptoms and general obstetric precautions including but  not limited to vaginal bleeding, contractions, leaking of fluid and fetal movement were reviewed in detail with the patient. Please refer to After Visit Summary for other counseling recommendations.  Return in about 2 weeks (around 09/22/2020) for OB visit, face to face, MD only.   Hermina Staggers, MD

## 2020-09-09 LAB — CBC
Hematocrit: 35.3 % (ref 34.0–46.6)
Hemoglobin: 11.1 g/dL (ref 11.1–15.9)
MCH: 23.7 pg — ABNORMAL LOW (ref 26.6–33.0)
MCHC: 31.4 g/dL — ABNORMAL LOW (ref 31.5–35.7)
MCV: 75 fL — ABNORMAL LOW (ref 79–97)
Platelets: 321 10*3/uL (ref 150–450)
RBC: 4.68 x10E6/uL (ref 3.77–5.28)
RDW: 13.8 % (ref 11.7–15.4)
WBC: 7.1 10*3/uL (ref 3.4–10.8)

## 2020-09-09 LAB — GLUCOSE TOLERANCE, 2 HOURS W/ 1HR
Glucose, 1 hour: 84 mg/dL (ref 65–179)
Glucose, 2 hour: 72 mg/dL (ref 65–152)
Glucose, Fasting: 75 mg/dL (ref 65–91)

## 2020-09-09 LAB — HIV ANTIBODY (ROUTINE TESTING W REFLEX): HIV Screen 4th Generation wRfx: NONREACTIVE

## 2020-09-09 LAB — RPR: RPR Ser Ql: NONREACTIVE

## 2020-09-22 ENCOUNTER — Other Ambulatory Visit: Payer: Self-pay

## 2020-09-22 ENCOUNTER — Encounter: Payer: Self-pay | Admitting: Family Medicine

## 2020-09-22 ENCOUNTER — Ambulatory Visit (INDEPENDENT_AMBULATORY_CARE_PROVIDER_SITE_OTHER): Payer: Medicaid Other | Admitting: Family Medicine

## 2020-09-22 VITALS — BP 114/81 | HR 70 | Wt 133.0 lb

## 2020-09-22 DIAGNOSIS — Z8751 Personal history of pre-term labor: Secondary | ICD-10-CM

## 2020-09-22 DIAGNOSIS — Z23 Encounter for immunization: Secondary | ICD-10-CM

## 2020-09-22 DIAGNOSIS — Z8759 Personal history of other complications of pregnancy, childbirth and the puerperium: Secondary | ICD-10-CM

## 2020-09-22 DIAGNOSIS — O0990 Supervision of high risk pregnancy, unspecified, unspecified trimester: Secondary | ICD-10-CM

## 2020-09-22 DIAGNOSIS — Z3009 Encounter for other general counseling and advice on contraception: Secondary | ICD-10-CM

## 2020-09-22 DIAGNOSIS — O34219 Maternal care for unspecified type scar from previous cesarean delivery: Secondary | ICD-10-CM

## 2020-09-22 NOTE — Progress Notes (Signed)
ROB [redacted]w[redacted]d 2 hr GTT was WNL  Tdap : Desires today.   CC: None

## 2020-09-22 NOTE — Progress Notes (Signed)
   Subjective:  Allison Boone is a 35 y.o. [phone] at [redacted]w[redacted]d being seen today for ongoing prenatal care.  She is currently monitored for the following issues for this high-risk pregnancy and has Supervision of high risk pregnancy, antepartum; History of stillbirth; History of cesarean delivery, currently pregnant; History of preterm delivery; and Unwanted fertility on their problem list.  Patient reports no complaints.  Contractions: Irritability. Vag. Bleeding: None.  Movement: Present. Denies leaking of fluid.   The following portions of the patient's history were reviewed and updated as appropriate: allergies, current medications, past family history, past medical history, past social history, past surgical history and problem list. Problem list updated.  Objective:   Vitals:   09/22/20 1001  BP: 114/81  Pulse: 70  Weight: 133 lb (60.3 kg)    Fetal Status: Fetal Heart Rate (bpm): 142 Fundal Height: 28 cm Movement: Present     General:  Alert, oriented and cooperative. Patient is in no acute distress.  Skin: Skin is warm and dry. No rash noted.   Cardiovascular: Normal heart rate noted  Respiratory: Normal respiratory effort, no problems with respiration noted  Abdomen: Soft, gravid, appropriate for gestational age. Pain/Pressure: Absent     Pelvic: Vag. Bleeding: None     Cervical exam deferred        Extremities: Normal range of motion.  Edema: Trace  Mental Status: Normal mood and affect. Normal behavior. Normal judgment and thought content.   Urinalysis:      Assessment and Plan:  Pregnancy: G4P0301 at [redacted]w[redacted]d  1. Supervision of high risk pregnancy, antepartum BP and FHR normal TDaP given today  2. Need for Tdap vaccination - Tdap vaccine greater than or equal to 7yo IM  3. Unwanted fertility Signed papers 08/19/20  4. History of stillbirth X2, unknown cause  5. History of preterm delivery With IUFD x2  6. History of cesarean delivery, currently pregnant Desires  TOLAC, consent signed 09/08/2020  Preterm labor symptoms and general obstetric precautions including but not limited to vaginal bleeding, contractions, leaking of fluid and fetal movement were reviewed in detail with the patient. Please refer to After Visit Summary for other counseling recommendations.  Return in 2 weeks (on 10/06/2020) for [facility], ob visit.   Venora Maples, MD

## 2020-09-22 NOTE — Patient Instructions (Signed)
 Contraception Choices Contraception, also called birth control, refers to methods or devices that prevent pregnancy. Hormonal methods Contraceptive implant A contraceptive implant is a thin, plastic tube that contains a hormone that prevents pregnancy. It is different from an intrauterine device (IUD). It is inserted into the upper part of the arm by a health care provider. Implants can be effective for up to 3 years. Progestin-only injections Progestin-only injections are injections of progestin, a synthetic form of the hormone progesterone. They are given every 3 months by a health care provider. Birth control pills Birth control pills are pills that contain hormones that prevent pregnancy. They must be taken once a day, preferably at the same time each day. A prescription is needed to use this method of contraception. Birth control patch The birth control patch contains hormones that prevent pregnancy. It is placed on the skin and must be changed once a week for three weeks and removed on the fourth week. A prescription is needed to use this method of contraception. Vaginal ring A vaginal ring contains hormones that prevent pregnancy. It is placed in the vagina for three weeks and removed on the fourth week. After that, the process is repeated with a new ring. A prescription is needed to use this method of contraception. Emergency contraceptive Emergency contraceptives prevent pregnancy after unprotected sex. They come in pill form and can be taken up to 5 days after sex. They work best the sooner they are taken after having sex. Most emergency contraceptives are available without a prescription. This method should not be used as your only form of birth control.   Barrier methods Female condom A female condom is a thin sheath that is worn over the penis during sex. Condoms keep sperm from going inside a woman's body. They can be used with a sperm-killing substance (spermicide) to increase their  effectiveness. They should be thrown away after one use. Female condom A female condom is a soft, loose-fitting sheath that is put into the vagina before sex. The condom keeps sperm from going inside a woman's body. They should be thrown away after one use. Diaphragm A diaphragm is a soft, dome-shaped barrier. It is inserted into the vagina before sex, along with a spermicide. The diaphragm blocks sperm from entering the uterus, and the spermicide kills sperm. A diaphragm should be left in the vagina for 6-8 hours after sex and removed within 24 hours. A diaphragm is prescribed and fitted by a health care provider. A diaphragm should be replaced every 1-2 years, after giving birth, after gaining more than 15 lb (6.8 kg), and after pelvic surgery. Cervical cap A cervical cap is a round, soft latex or plastic cup that fits over the cervix. It is inserted into the vagina before sex, along with spermicide. It blocks sperm from entering the uterus. The cap should be left in place for 6-8 hours after sex and removed within 48 hours. A cervical cap must be prescribed and fitted by a health care provider. It should be replaced every 2 years. Sponge A sponge is a soft, circular piece of polyurethane foam with spermicide in it. The sponge helps block sperm from entering the uterus, and the spermicide kills sperm. To use it, you make it wet and then insert it into the vagina. It should be inserted before sex, left in for at least 6 hours after sex, and removed and thrown away within 30 hours. Spermicides Spermicides are chemicals that kill or block sperm from entering the   cervix and uterus. They can come as a cream, jelly, suppository, foam, or tablet. A spermicide should be inserted into the vagina with an applicator at least 10-15 minutes before sex to allow time for it to work. The process must be repeated every time you have sex. Spermicides do not require a prescription.   Intrauterine  contraception Intrauterine device (IUD) An IUD is a T-shaped device that is put in a woman's uterus. There are two types:  Hormone IUD.This type contains progestin, a synthetic form of the hormone progesterone. This type can stay in place for 3-5 years.  Copper IUD.This type is wrapped in copper wire. It can stay in place for 10 years. Permanent methods of contraception Female tubal ligation In this method, a woman's fallopian tubes are sealed, tied, or blocked during surgery to prevent eggs from traveling to the uterus. Hysteroscopic sterilization In this method, a small, flexible insert is placed into each fallopian tube. The inserts cause scar tissue to form in the fallopian tubes and block them, so sperm cannot reach an egg. The procedure takes about 3 months to be effective. Another form of birth control must be used during those 3 months. Female sterilization This is a procedure to tie off the tubes that carry sperm (vasectomy). After the procedure, the man can still ejaculate fluid (semen). Another form of birth control must be used for 3 months after the procedure. Natural planning methods Natural family planning In this method, a couple does not have sex on days when the woman could become pregnant. Calendar method In this method, the woman keeps track of the length of each menstrual cycle, identifies the days when pregnancy can happen, and does not have sex on those days. Ovulation method In this method, a couple avoids sex during ovulation. Symptothermal method This method involves not having sex during ovulation. The woman typically checks for ovulation by watching changes in her temperature and in the consistency of cervical mucus. Post-ovulation method In this method, a couple waits to have sex until after ovulation. Where to find more information  Centers for Disease Control and Prevention: www.cdc.gov Summary  Contraception, also called birth control, refers to methods or  devices that prevent pregnancy.  Hormonal methods of contraception include implants, injections, pills, patches, vaginal rings, and emergency contraceptives.  Barrier methods of contraception can include female condoms, female condoms, diaphragms, cervical caps, sponges, and spermicides.  There are two types of IUDs (intrauterine devices). An IUD can be put in a woman's uterus to prevent pregnancy for 3-5 years.  Permanent sterilization can be done through a procedure for males and females. Natural family planning methods involve nothaving sex on days when the woman could become pregnant. This information is not intended to replace advice given to you by your health care provider. Make sure you discuss any questions you have with your health care provider. Document Revised: 09/08/2019 Document Reviewed: 09/08/2019 Elsevier Patient Education  2021 Elsevier Inc.   Breastfeeding  Choosing to breastfeed is one of the best decisions you can make for yourself and your baby. A change in hormones during pregnancy causes your breasts to make breast milk in your milk-producing glands. Hormones prevent breast milk from being released before your baby is born. They also prompt milk flow after birth. Once breastfeeding has begun, thoughts of your baby, as well as his or her sucking or crying, can stimulate the release of milk from your milk-producing glands. Benefits of breastfeeding Research shows that breastfeeding offers many health benefits   for infants and mothers. It also offers a cost-free and convenient way to feed your baby. For your baby  Your first milk (colostrum) helps your baby's digestive system to function better.  Special cells in your milk (antibodies) help your baby to fight off infections.  Breastfed babies are less likely to develop asthma, allergies, obesity, or type 2 diabetes. They are also at lower risk for sudden infant death syndrome (SIDS).  Nutrients in breast milk are better  able to meet your baby's needs compared to infant formula.  Breast milk improves your baby's brain development. For you  Breastfeeding helps to create a very special bond between you and your baby.  Breastfeeding is convenient. Breast milk costs nothing and is always available at the correct temperature.  Breastfeeding helps to burn calories. It helps you to lose the weight that you gained during pregnancy.  Breastfeeding makes your uterus return faster to its size before pregnancy. It also slows bleeding (lochia) after you give birth.  Breastfeeding helps to lower your risk of developing type 2 diabetes, osteoporosis, rheumatoid arthritis, cardiovascular disease, and breast, ovarian, uterine, and endometrial cancer later in life. Breastfeeding basics Starting breastfeeding  Find a comfortable place to sit or lie down, with your neck and back well-supported.  Place a pillow or a rolled-up blanket under your baby to bring him or her to the level of your breast (if you are seated). Nursing pillows are specially designed to help support your arms and your baby while you breastfeed.  Make sure that your baby's tummy (abdomen) is facing your abdomen.  Gently massage your breast. With your fingertips, massage from the outer edges of your breast inward toward the nipple. This encourages milk flow. If your milk flows slowly, you may need to continue this action during the feeding.  Support your breast with 4 fingers underneath and your thumb above your nipple (make the letter "C" with your hand). Make sure your fingers are well away from your nipple and your baby's mouth.  Stroke your baby's lips gently with your finger or nipple.  When your baby's mouth is open wide enough, quickly bring your baby to your breast, placing your entire nipple and as much of the areola as possible into your baby's mouth. The areola is the colored area around your nipple. ? More areola should be visible above your  baby's upper lip than below the lower lip. ? Your baby's lips should be opened and extended outward (flanged) to ensure an adequate, comfortable latch. ? Your baby's tongue should be between his or her lower gum and your breast.  Make sure that your baby's mouth is correctly positioned around your nipple (latched). Your baby's lips should create a seal on your breast and be turned out (everted).  It is common for your baby to suck about 2-3 minutes in order to start the flow of breast milk. Latching Teaching your baby how to latch onto your breast properly is very important. An improper latch can cause nipple pain, decreased milk supply, and poor weight gain in your baby. Also, if your baby is not latched onto your nipple properly, he or she may swallow some air during feeding. This can make your baby fussy. Burping your baby when you switch breasts during the feeding can help to get rid of the air. However, teaching your baby to latch on properly is still the best way to prevent fussiness from swallowing air while breastfeeding. Signs that your baby has successfully latched onto   your nipple  Silent tugging or silent sucking, without causing you pain. Infant's lips should be extended outward (flanged).  Swallowing heard between every 3-4 sucks once your milk has started to flow (after your let-down milk reflex occurs).  Muscle movement above and in front of his or her ears while sucking. Signs that your baby has not successfully latched onto your nipple  Sucking sounds or smacking sounds from your baby while breastfeeding.  Nipple pain. If you think your baby has not latched on correctly, slip your finger into the corner of your baby's mouth to break the suction and place it between your baby's gums. Attempt to start breastfeeding again. Signs of successful breastfeeding Signs from your baby  Your baby will gradually decrease the number of sucks or will completely stop sucking.  Your baby  will fall asleep.  Your baby's body will relax.  Your baby will retain a small amount of milk in his or her mouth.  Your baby will let go of your breast by himself or herself. Signs from you  Breasts that have increased in firmness, weight, and size 1-3 hours after feeding.  Breasts that are softer immediately after breastfeeding.  Increased milk volume, as well as a change in milk consistency and color by the fifth day of breastfeeding.  Nipples that are not sore, cracked, or bleeding. Signs that your baby is getting enough milk  Wetting at least 1-2 diapers during the first 24 hours after birth.  Wetting at least 5-6 diapers every 24 hours for the first week after birth. The urine should be clear or pale yellow by the age of 5 days.  Wetting 6-8 diapers every 24 hours as your baby continues to grow and develop.  At least 3 stools in a 24-hour period by the age of 5 days. The stool should be soft and yellow.  At least 3 stools in a 24-hour period by the age of 7 days. The stool should be seedy and yellow.  No loss of weight greater than 10% of birth weight during the first 3 days of life.  Average weight gain of 4-7 oz (113-198 g) per week after the age of 4 days.  Consistent daily weight gain by the age of 5 days, without weight loss after the age of 2 weeks. After a feeding, your baby may spit up a small amount of milk. This is normal. Breastfeeding frequency and duration Frequent feeding will help you make more milk and can prevent sore nipples and extremely full breasts (breast engorgement). Breastfeed when you feel the need to reduce the fullness of your breasts or when your baby shows signs of hunger. This is called "breastfeeding on demand." Signs that your baby is hungry include:  Increased alertness, activity, or restlessness.  Movement of the head from side to side.  Opening of the mouth when the corner of the mouth or cheek is stroked (rooting).  Increased  sucking sounds, smacking lips, cooing, sighing, or squeaking.  Hand-to-mouth movements and sucking on fingers or hands.  Fussing or crying. Avoid introducing a pacifier to your baby in the first 4-6 weeks after your baby is born. After this time, you may choose to use a pacifier. Research has shown that pacifier use during the first year of a baby's life decreases the risk of sudden infant death syndrome (SIDS). Allow your baby to feed on each breast as long as he or she wants. When your baby unlatches or falls asleep while feeding from the   first breast, offer the second breast. Because newborns are often sleepy in the first few weeks of life, you may need to awaken your baby to get him or her to feed. Breastfeeding times will vary from baby to baby. However, the following rules can serve as a guide to help you make sure that your baby is properly fed:  Newborns (babies 4 weeks of age or younger) may breastfeed every 1-3 hours.  Newborns should not go without breastfeeding for longer than 3 hours during the day or 5 hours during the night.  You should breastfeed your baby a minimum of 8 times in a 24-hour period. Breast milk pumping Pumping and storing breast milk allows you to make sure that your baby is exclusively fed your breast milk, even at times when you are unable to breastfeed. This is especially important if you go back to work while you are still breastfeeding, or if you are not able to be present during feedings. Your lactation consultant can help you find a method of pumping that works best for you and give you guidelines about how long it is safe to store breast milk.      Caring for your breasts while you breastfeed Nipples can become dry, cracked, and sore while breastfeeding. The following recommendations can help keep your breasts moisturized and healthy:  Avoid using soap on your nipples.  Wear a supportive bra designed especially for nursing. Avoid wearing underwire-style  bras or extremely tight bras (sports bras).  Air-dry your nipples for 3-4 minutes after each feeding.  Use only cotton bra pads to absorb leaked breast milk. Leaking of breast milk between feedings is normal.  Use lanolin on your nipples after breastfeeding. Lanolin helps to maintain your skin's normal moisture barrier. Pure lanolin is not harmful (not toxic) to your baby. You may also hand express a few drops of breast milk and gently massage that milk into your nipples and allow the milk to air-dry. In the first few weeks after giving birth, some women experience breast engorgement. Engorgement can make your breasts feel heavy, warm, and tender to the touch. Engorgement peaks within 3-5 days after you give birth. The following recommendations can help to ease engorgement:  Completely empty your breasts while breastfeeding or pumping. You may want to start by applying warm, moist heat (in the shower or with warm, water-soaked hand towels) just before feeding or pumping. This increases circulation and helps the milk flow. If your baby does not completely empty your breasts while breastfeeding, pump any extra milk after he or she is finished.  Apply ice packs to your breasts immediately after breastfeeding or pumping, unless this is too uncomfortable for you. To do this: ? Put ice in a plastic bag. ? Place a towel between your skin and the bag. ? Leave the ice on for 20 minutes, 2-3 times a day.  Make sure that your baby is latched on and positioned properly while breastfeeding. If engorgement persists after 48 hours of following these recommendations, contact your health care provider or a lactation consultant. Overall health care recommendations while breastfeeding  Eat 3 healthy meals and 3 snacks every day. Well-nourished mothers who are breastfeeding need an additional 450-500 calories a day. You can meet this requirement by increasing the amount of a balanced diet that you eat.  Drink  enough water to keep your urine pale yellow or clear.  Rest often, relax, and continue to take your prenatal vitamins to prevent fatigue, stress, and low   vitamin and mineral levels in your body (nutrient deficiencies).  Do not use any products that contain nicotine or tobacco, such as cigarettes and e-cigarettes. Your baby may be harmed by chemicals from cigarettes that pass into breast milk and exposure to secondhand smoke. If you need help quitting, ask your health care provider.  Avoid alcohol.  Do not use illegal drugs or marijuana.  Talk with your health care provider before taking any medicines. These include over-the-counter and prescription medicines as well as vitamins and herbal supplements. Some medicines that may be harmful to your baby can pass through breast milk.  It is possible to become pregnant while breastfeeding. If birth control is desired, ask your health care provider about options that will be safe while breastfeeding your baby. Where to find more information: [employer]: www.llli.org Contact a health care provider if:  You feel like you want to stop breastfeeding or have become frustrated with breastfeeding.  Your nipples are cracked or bleeding.  Your breasts are red, tender, or warm.  You have: ? Painful breasts or nipples. ? A swollen area on either breast. ? A fever or chills. ? Nausea or vomiting. ? Drainage other than breast milk from your nipples.  Your breasts do not become full before feedings by the fifth day after you give birth.  You feel sad and depressed.  Your baby is: ? Too sleepy to eat well. ? Having trouble sleeping. ? More than 1 week old and wetting fewer than 6 diapers in a 24-hour period. ? Not gaining weight by 5 days of age.  Your baby has fewer than 3 stools in a 24-hour period.  Your baby's skin or the white parts of his or her eyes become yellow. Get help right away if:  Your baby is overly tired  (lethargic) and does not want to wake up and feed.  Your baby develops an unexplained fever. Summary  Breastfeeding offers many health benefits for infant and mothers.  Try to breastfeed your infant when he or she shows early signs of hunger.  Gently tickle or stroke your baby's lips with your finger or nipple to allow the baby to open his or her mouth. Bring the baby to your breast. Make sure that much of the areola is in your baby's mouth. Offer one side and burp the baby before you offer the other side.  Talk with your health care provider or lactation consultant if you have questions or you face problems as you breastfeed. This information is not intended to replace advice given to you by your health care provider. Make sure you discuss any questions you have with your health care provider. Document Revised: 06/28/2017 Document Reviewed: 05/05/2016 Elsevier Patient Education  2021 Elsevier Inc.  

## 2020-09-29 ENCOUNTER — Telehealth: Payer: Self-pay | Admitting: Obstetrics & Gynecology

## 2020-09-29 NOTE — Telephone Encounter (Signed)
     [facility] OB/GYN Physician Phone Call Documentation  I received a phone call from [facility] about elevated BP of 144/89 for [facility]  Recheck BP 145/85.  Talked to patient. Patient denies any headaches, visual symptoms, RUQ/epigastric pain or other concerning symptoms.  She was told that she will be seen tomorrow at [facility] for BP check, if still elevated will need labs and other management. Cautioned to come to [facility] for any preeclampsia or other concerning symptoms in the meantime. Patient agreed with this plan.     Jaynie Collins, MD, FACOG Obstetrician & Gynecologist, [facility] for [employer], [facility]

## 2020-09-30 ENCOUNTER — Other Ambulatory Visit: Payer: Self-pay

## 2020-09-30 ENCOUNTER — Encounter: Payer: Self-pay | Admitting: Obstetrics

## 2020-09-30 ENCOUNTER — Ambulatory Visit (INDEPENDENT_AMBULATORY_CARE_PROVIDER_SITE_OTHER): Payer: Medicaid Other | Admitting: Obstetrics

## 2020-09-30 VITALS — BP 136/88 | HR 65

## 2020-09-30 DIAGNOSIS — O133 Gestational [pregnancy-induced] hypertension without significant proteinuria, third trimester: Secondary | ICD-10-CM

## 2020-09-30 DIAGNOSIS — Z3A29 29 weeks gestation of pregnancy: Secondary | ICD-10-CM

## 2020-09-30 DIAGNOSIS — O0990 Supervision of high risk pregnancy, unspecified, unspecified trimester: Secondary | ICD-10-CM

## 2020-09-30 NOTE — Progress Notes (Cosign Needed)
Subjective:  Allison Boone is a 35 y.o. female here for BP check.   Hypertension ROS: Not taking any medication at this time. She does not report any dizzy, blurred vision,sob or headaches.    Objective:  LMP 03/09/2020 (Approximate)   Appearance alert, well appearing, and in no distress and well hydrated. General exam BP noted to be well controlled today in office.    Assessment:   Blood Pressure poorly controlled.   Plan:  Orders and follow up as documented in patient record.. Per Dr.Arnold, we will call patient with the next plan of action once labs have been reviewed. Patient voice understanding.      Patient was assessed and managed by nursing staff during this encounter. I have reviewed the chart and agree with the documentation and plan. I have also made any necessary editorial changes.  Allison Ceo, MD 09/30/2020 11:08 AM

## 2020-10-01 ENCOUNTER — Ambulatory Visit (HOSPITAL_BASED_OUTPATIENT_CLINIC_OR_DEPARTMENT_OTHER): Payer: Medicaid Other | Admitting: *Deleted

## 2020-10-01 ENCOUNTER — Encounter: Payer: Self-pay | Admitting: *Deleted

## 2020-10-01 ENCOUNTER — Ambulatory Visit (HOSPITAL_BASED_OUTPATIENT_CLINIC_OR_DEPARTMENT_OTHER): Payer: Medicaid Other

## 2020-10-01 ENCOUNTER — Other Ambulatory Visit: Payer: Self-pay

## 2020-10-01 ENCOUNTER — Ambulatory Visit: Payer: Medicaid Other | Admitting: *Deleted

## 2020-10-01 ENCOUNTER — Inpatient Hospital Stay (HOSPITAL_COMMUNITY)
Admission: AD | Admit: 2020-10-01 | Discharge: 2020-10-05 | DRG: 788 | Disposition: A | Discharge status: Home / Self Care | Payer: Medicaid Other | Attending: Obstetrics & Gynecology | Admitting: Obstetrics & Gynecology

## 2020-10-01 VITALS — BP 145/82 | HR 68

## 2020-10-01 DIAGNOSIS — O09212 Supervision of pregnancy with history of pre-term labor, second trimester: Secondary | ICD-10-CM

## 2020-10-01 DIAGNOSIS — Z8751 Personal history of pre-term labor: Secondary | ICD-10-CM

## 2020-10-01 DIAGNOSIS — O09523 Supervision of elderly multigravida, third trimester: Secondary | ICD-10-CM | POA: Insufficient documentation

## 2020-10-01 DIAGNOSIS — O09293 Supervision of pregnancy with other poor reproductive or obstetric history, third trimester: Secondary | ICD-10-CM

## 2020-10-01 DIAGNOSIS — Z880 Allergy status to penicillin: Secondary | ICD-10-CM

## 2020-10-01 DIAGNOSIS — Z3A29 29 weeks gestation of pregnancy: Secondary | ICD-10-CM

## 2020-10-01 DIAGNOSIS — Z8759 Personal history of other complications of pregnancy, childbirth and the puerperium: Secondary | ICD-10-CM

## 2020-10-01 DIAGNOSIS — O0990 Supervision of high risk pregnancy, unspecified, unspecified trimester: Secondary | ICD-10-CM | POA: Insufficient documentation

## 2020-10-01 DIAGNOSIS — O1414 Severe pre-eclampsia complicating childbirth: Principal | ICD-10-CM | POA: Diagnosis present

## 2020-10-01 DIAGNOSIS — Z20822 Contact with and (suspected) exposure to covid-19: Secondary | ICD-10-CM | POA: Diagnosis present

## 2020-10-01 DIAGNOSIS — O4593 Premature separation of placenta, unspecified, third trimester: Secondary | ICD-10-CM | POA: Diagnosis present

## 2020-10-01 DIAGNOSIS — Z3009 Encounter for other general counseling and advice on contraception: Secondary | ICD-10-CM | POA: Diagnosis present

## 2020-10-01 DIAGNOSIS — O1413 Severe pre-eclampsia, third trimester: Secondary | ICD-10-CM

## 2020-10-01 DIAGNOSIS — Z98891 History of uterine scar from previous surgery: Secondary | ICD-10-CM

## 2020-10-01 DIAGNOSIS — O34219 Maternal care for unspecified type scar from previous cesarean delivery: Secondary | ICD-10-CM | POA: Insufficient documentation

## 2020-10-01 DIAGNOSIS — O1410 Severe pre-eclampsia, unspecified trimester: Secondary | ICD-10-CM | POA: Diagnosis present

## 2020-10-01 DIAGNOSIS — O34211 Maternal care for low transverse scar from previous cesarean delivery: Secondary | ICD-10-CM | POA: Diagnosis present

## 2020-10-01 DIAGNOSIS — O9902 Anemia complicating childbirth: Secondary | ICD-10-CM | POA: Diagnosis present

## 2020-10-01 LAB — COMPREHENSIVE METABOLIC PANEL
ALT: 14 IU/L (ref 0–32)
AST: 23 IU/L (ref 0–40)
Albumin/Globulin Ratio: 0.9 — ABNORMAL LOW (ref 1.2–2.2)
Albumin: 3 g/dL — ABNORMAL LOW (ref 3.8–4.8)
Alkaline Phosphatase: 91 IU/L (ref 44–121)
BUN/Creatinine Ratio: 13 (ref 9–23)
BUN: 12 mg/dL (ref 6–20)
Bilirubin Total: 0.2 mg/dL (ref 0.0–1.2)
CO2: 19 mmol/L — ABNORMAL LOW (ref 20–29)
Calcium: 8.5 mg/dL — ABNORMAL LOW (ref 8.7–10.2)
Chloride: 106 mmol/L (ref 96–106)
Creatinine, Ser: 0.9 mg/dL (ref 0.57–1.00)
Globulin, Total: 3.2 g/dL (ref 1.5–4.5)
Glucose: 86 mg/dL (ref 65–99)
Potassium: 4 mmol/L (ref 3.5–5.2)
Sodium: 140 mmol/L (ref 134–144)
Total Protein: 6.2 g/dL (ref 6.0–8.5)
eGFR: 85 mL/min/{1.73_m2} (ref >59)

## 2020-10-01 LAB — CBC
Hematocrit: 35.6 % (ref 34.0–46.6)
Hemoglobin: 11.2 g/dL (ref 11.1–15.9)
MCH: 23.2 pg — ABNORMAL LOW (ref 26.6–33.0)
MCHC: 31.5 g/dL (ref 31.5–35.7)
MCV: 74 fL — ABNORMAL LOW (ref 79–97)
Platelets: 257 10*3/uL (ref 150–450)
RBC: 4.82 x10E6/uL (ref 3.77–5.28)
RDW: 13.8 % (ref 11.7–15.4)
WBC: 7.4 10*3/uL (ref 3.4–10.8)

## 2020-10-01 LAB — PROTEIN / CREATININE RATIO, URINE
Creatinine, Urine: 197.4 mg/dL
Protein, Ur: 131.5 mg/dL
Protein/Creat Ratio: 666 mg/g creat — ABNORMAL HIGH (ref 0–200)

## 2020-10-01 MED ORDER — LABETALOL HCL 5 MG/ML IV SOLN
40.0000 mg | INTRAVENOUS | Status: DC | PRN
  Administered 2020-10-02: 40 mg via INTRAVENOUS
  Filled 2020-10-01: qty 8

## 2020-10-01 MED ORDER — LACTATED RINGERS IV SOLN: INTRAVENOUS | Status: DC

## 2020-10-01 MED ORDER — HYDRALAZINE HCL 20 MG/ML IJ SOLN: 10.0000 mg | INTRAMUSCULAR | Status: DC | PRN

## 2020-10-01 MED ORDER — LABETALOL HCL 5 MG/ML IV SOLN
80.0000 mg | INTRAVENOUS | Status: DC | PRN
  Administered 2020-10-02: 80 mg via INTRAVENOUS
  Filled 2020-10-01 (×2): qty 16

## 2020-10-01 MED ORDER — LABETALOL HCL 5 MG/ML IV SOLN
20.0000 mg | INTRAVENOUS | Status: DC | PRN
  Administered 2020-10-01: 20 mg via INTRAVENOUS
  Filled 2020-10-01: qty 4

## 2020-10-01 MED ORDER — BETAMETHASONE SOD PHOS & ACET 6 (3-3) MG/ML IJ SUSP
12.0000 mg | INTRAMUSCULAR | Status: DC
  Administered 2020-10-02: 12 mg via INTRAMUSCULAR
  Filled 2020-10-01: qty 5

## 2020-10-01 NOTE — [facility] Note (Signed)
Pt reports her b/p has been elevated for the last few days, headache x 4-5 hours .

## 2020-10-01 NOTE — Procedures (Signed)
Allison Boone May 28, 1985 [redacted]w[redacted]d  Fetus A Non-Stress Test Interpretation for 10/01/20  Indication:  AMA  Fetal Heart Rate A Mode: External Baseline Rate (A): 130 bpm Variability: Moderate Accelerations: 10 x 10 Decelerations: None Multiple birth?: No  Uterine Activity Mode: Palpation, Toco Contraction Frequency (min): none Resting Tone Palpated: Relaxed Resting Time: Adequate  Interpretation (Fetal Testing) Nonstress Test Interpretation: Reactive Overall Impression: Reassuring for gestational age Comments: Dr. Parke Poisson reviewed tracing.

## 2020-10-02 ENCOUNTER — Inpatient Hospital Stay (HOSPITAL_COMMUNITY): Payer: Medicaid Other | Admitting: Certified Registered Nurse Anesthetist

## 2020-10-02 ENCOUNTER — Encounter (HOSPITAL_COMMUNITY): Payer: Self-pay | Admitting: Obstetrics & Gynecology

## 2020-10-02 ENCOUNTER — Encounter (HOSPITAL_COMMUNITY)
Admission: AD | Disposition: A | Discharge status: Home / Self Care | Payer: Self-pay | Source: Home / Self Care | Attending: Family Medicine

## 2020-10-02 DIAGNOSIS — O1414 Severe pre-eclampsia complicating childbirth: Secondary | ICD-10-CM | POA: Diagnosis present

## 2020-10-02 DIAGNOSIS — O134 Gestational [pregnancy-induced] hypertension without significant proteinuria, complicating childbirth: Secondary | ICD-10-CM

## 2020-10-02 DIAGNOSIS — R030 Elevated blood-pressure reading, without diagnosis of hypertension: Secondary | ICD-10-CM | POA: Diagnosis present

## 2020-10-02 DIAGNOSIS — O4593 Premature separation of placenta, unspecified, third trimester: Secondary | ICD-10-CM | POA: Diagnosis present

## 2020-10-02 DIAGNOSIS — O34211 Maternal care for low transverse scar from previous cesarean delivery: Secondary | ICD-10-CM | POA: Diagnosis present

## 2020-10-02 DIAGNOSIS — Z880 Allergy status to penicillin: Secondary | ICD-10-CM | POA: Diagnosis not present

## 2020-10-02 DIAGNOSIS — Z20822 Contact with and (suspected) exposure to covid-19: Secondary | ICD-10-CM | POA: Diagnosis present

## 2020-10-02 DIAGNOSIS — O1410 Severe pre-eclampsia, unspecified trimester: Secondary | ICD-10-CM | POA: Diagnosis present

## 2020-10-02 DIAGNOSIS — O9902 Anemia complicating childbirth: Secondary | ICD-10-CM | POA: Diagnosis present

## 2020-10-02 DIAGNOSIS — Z3A29 29 weeks gestation of pregnancy: Secondary | ICD-10-CM

## 2020-10-02 LAB — RESP PANEL BY RT-PCR (FLU A&B, COVID) ARPGX2
Influenza A by PCR: NEGATIVE
Influenza B by PCR: NEGATIVE
SARS Coronavirus 2 by RT PCR: NEGATIVE

## 2020-10-02 LAB — COMPREHENSIVE METABOLIC PANEL
ALT: 27 U/L (ref 0–44)
AST: 31 U/L (ref 15–41)
Albumin: 2.1 g/dL — ABNORMAL LOW (ref 3.5–5.0)
Alkaline Phosphatase: 69 U/L (ref 38–126)
Anion gap: 8 (ref 5–15)
BUN: 12 mg/dL (ref 6–20)
CO2: 19 mmol/L — ABNORMAL LOW (ref 22–32)
Calcium: 8 mg/dL — ABNORMAL LOW (ref 8.9–10.3)
Chloride: 107 mmol/L (ref 98–111)
Creatinine, Ser: 0.8 mg/dL (ref 0.44–1.00)
GFR, Estimated: >60 mL/min (ref >60)
Glucose, Bld: 89 mg/dL (ref 70–99)
Potassium: 3.6 mmol/L (ref 3.5–5.1)
Sodium: 134 mmol/L — ABNORMAL LOW (ref 135–145)
Total Bilirubin: 0.5 mg/dL (ref 0.3–1.2)
Total Protein: 5.5 g/dL — ABNORMAL LOW (ref 6.5–8.1)

## 2020-10-02 LAB — CBC
HCT: 33.9 % — ABNORMAL LOW (ref 36.0–46.0)
Hemoglobin: 10.7 g/dL — ABNORMAL LOW (ref 12.0–15.0)
MCH: 23.4 pg — ABNORMAL LOW (ref 26.0–34.0)
MCHC: 31.6 g/dL (ref 30.0–36.0)
MCV: 74 fL — ABNORMAL LOW (ref 80.0–100.0)
Platelets: 250 10*3/uL (ref 150–400)
RBC: 4.58 MIL/uL (ref 3.87–5.11)
RDW: 14.4 % (ref 11.5–15.5)
WBC: 6.9 10*3/uL (ref 4.0–10.5)
nRBC: 0 % (ref 0.0–0.2)

## 2020-10-02 LAB — TYPE AND SCREEN
ABO/RH(D): O POS
Antibody Screen: NEGATIVE

## 2020-10-02 LAB — PROTEIN / CREATININE RATIO, URINE
Creatinine, Urine: 181.71 mg/dL
Protein Creatinine Ratio: 4.81 mg/mg{Cre} — ABNORMAL HIGH (ref 0.00–0.15)
Total Protein, Urine: 874 mg/dL

## 2020-10-02 SURGERY — Surgical Case

## 2020-10-02 MED ORDER — TETANUS-DIPHTH-ACELL PERTUSSIS 5-2.5-18.5 LF-MCG/0.5 IM SUSY: 0.5000 mL | PREFILLED_SYRINGE | Freq: Once | INTRAMUSCULAR | Status: DC

## 2020-10-02 MED ORDER — STERILE WATER FOR IRRIGATION IR SOLN
Status: DC | PRN
  Administered 2020-10-02: 1

## 2020-10-02 MED ORDER — MENTHOL 3 MG MT LOZG: 1.0000 | LOZENGE | OROMUCOSAL | Status: DC | PRN

## 2020-10-02 MED ORDER — CEFAZOLIN SODIUM-DEXTROSE 2-4 GM/100ML-% IV SOLN
INTRAVENOUS | Status: AC
  Filled 2020-10-02: qty 100

## 2020-10-02 MED ORDER — SOD CITRATE-CITRIC ACID 500-334 MG/5ML PO SOLN
ORAL | Status: AC
  Administered 2020-10-02: 30 mL
  Filled 2020-10-02: qty 30

## 2020-10-02 MED ORDER — COCONUT OIL OIL: 1.0000 "application " | TOPICAL_OIL | Status: DC | PRN

## 2020-10-02 MED ORDER — LACTATED RINGERS IV SOLN: INTRAVENOUS | Status: DC | PRN

## 2020-10-02 MED ORDER — SODIUM CHLORIDE 0.9 % IR SOLN
Status: DC | PRN
  Administered 2020-10-02: 1

## 2020-10-02 MED ORDER — OXYTOCIN-SODIUM CHLORIDE 30-0.9 UT/500ML-% IV SOLN
2.5000 [IU]/h | INTRAVENOUS | Status: DC
  Administered 2020-10-02: 2.5 [IU]/h via INTRAVENOUS

## 2020-10-02 MED ORDER — BUPIVACAINE HCL 0.25 % IJ SOLN
INTRAMUSCULAR | Status: DC | PRN
  Administered 2020-10-02: 30 mL

## 2020-10-02 MED ORDER — MIDAZOLAM HCL 2 MG/2ML IJ SOLN
INTRAMUSCULAR | Status: DC | PRN
  Administered 2020-10-02: 2 mg via INTRAVENOUS

## 2020-10-02 MED ORDER — ACETAMINOPHEN 325 MG PO TABS
650.0000 mg | ORAL_TABLET | ORAL | Status: DC | PRN
  Administered 2020-10-02: 650 mg via ORAL
  Filled 2020-10-02: qty 2

## 2020-10-02 MED ORDER — HYDROMORPHONE HCL 1 MG/ML IJ SOLN: 0.2500 mg | INTRAMUSCULAR | Status: DC | PRN

## 2020-10-02 MED ORDER — KETOROLAC TROMETHAMINE 30 MG/ML IJ SOLN
30.0000 mg | Freq: Once | INTRAMUSCULAR | Status: AC
  Administered 2020-10-02: 30 mg via INTRAVENOUS
  Filled 2020-10-02: qty 1

## 2020-10-02 MED ORDER — MAGNESIUM SULFATE 40 GM/1000ML IV SOLN
2.0000 g/h | INTRAVENOUS | Status: DC
  Administered 2020-10-02 (×2): 2 g/h via INTRAVENOUS
  Filled 2020-10-02: qty 1000

## 2020-10-02 MED ORDER — LACTATED RINGERS IV SOLN: INTRAVENOUS | Status: DC

## 2020-10-02 MED ORDER — MAGNESIUM SULFATE 2 GM/50ML IV SOLN: 2.0000 g | Freq: Once | INTRAVENOUS | Status: DC

## 2020-10-02 MED ORDER — LACTATED RINGERS IV BOLUS: 1000.0000 mL | Freq: Once | INTRAVENOUS | Status: AC

## 2020-10-02 MED ORDER — MEASLES, MUMPS & RUBELLA VAC IJ SOLR: 0.5000 mL | Freq: Once | INTRAMUSCULAR | Status: DC

## 2020-10-02 MED ORDER — LIDOCAINE 2% (20 MG/ML) 5 ML SYRINGE
INTRAMUSCULAR | Status: AC
  Filled 2020-10-02: qty 5

## 2020-10-02 MED ORDER — SIMETHICONE 80 MG PO CHEW: 80.0000 mg | CHEWABLE_TABLET | ORAL | Status: DC | PRN

## 2020-10-02 MED ORDER — TERBUTALINE SULFATE 1 MG/ML IJ SOLN
INTRAMUSCULAR | Status: AC
  Administered 2020-10-02: 0.25 mg via SUBCUTANEOUS
  Filled 2020-10-02: qty 1

## 2020-10-02 MED ORDER — DEXAMETHASONE SODIUM PHOSPHATE 10 MG/ML IJ SOLN
INTRAMUSCULAR | Status: DC | PRN
  Administered 2020-10-02: 10 mg via INTRAVENOUS

## 2020-10-02 MED ORDER — SUCCINYLCHOLINE CHLORIDE 20 MG/ML IJ SOLN
INTRAMUSCULAR | Status: DC | PRN
  Administered 2020-10-02: 80 mg via INTRAVENOUS

## 2020-10-02 MED ORDER — PRENATAL MULTIVITAMIN CH: 1.0000 | ORAL_TABLET | Freq: Every day | ORAL | Status: DC

## 2020-10-02 MED ORDER — ENOXAPARIN SODIUM 40 MG/0.4ML IJ SOSY
40.0000 mg | PREFILLED_SYRINGE | INTRAMUSCULAR | Status: DC
  Administered 2020-10-03: 40 mg via SUBCUTANEOUS
  Filled 2020-10-02 (×3): qty 0.4

## 2020-10-02 MED ORDER — DIPHENHYDRAMINE HCL 25 MG PO CAPS: 25.0000 mg | ORAL_CAPSULE | Freq: Four times a day (QID) | ORAL | Status: DC | PRN

## 2020-10-02 MED ORDER — MAGNESIUM SULFATE BOLUS VIA INFUSION
4.0000 g | Freq: Once | INTRAVENOUS | Status: AC
  Administered 2020-10-02: 4 g via INTRAVENOUS
  Filled 2020-10-02: qty 1000

## 2020-10-02 MED ORDER — ACETAMINOPHEN 325 MG PO TABS: 650.0000 mg | ORAL_TABLET | ORAL | Status: DC | PRN

## 2020-10-02 MED ORDER — MIDAZOLAM HCL 2 MG/2ML IJ SOLN
INTRAMUSCULAR | Status: AC
  Filled 2020-10-02: qty 2

## 2020-10-02 MED ORDER — OXYTOCIN-SODIUM CHLORIDE 30-0.9 UT/500ML-% IV SOLN
INTRAVENOUS | Status: AC
  Filled 2020-10-02: qty 500

## 2020-10-02 MED ORDER — MAGNESIUM SULFATE 40 GM/1000ML IV SOLN
2.0000 g/h | INTRAVENOUS | Status: DC
  Administered 2020-10-02: 2 g/h via INTRAVENOUS
  Filled 2020-10-02: qty 1000

## 2020-10-02 MED ORDER — OXYCODONE HCL 5 MG PO TABS: 5.0000 mg | ORAL_TABLET | ORAL | Status: DC | PRN

## 2020-10-02 MED ORDER — CALCIUM CARBONATE ANTACID 500 MG PO CHEW: 2.0000 | CHEWABLE_TABLET | ORAL | Status: DC | PRN

## 2020-10-02 MED ORDER — PRENATAL MULTIVITAMIN CH
1.0000 | ORAL_TABLET | Freq: Every day | ORAL | Status: DC
  Administered 2020-10-02 – 2020-10-05 (×4): 1 via ORAL
  Filled 2020-10-02 (×4): qty 1

## 2020-10-02 MED ORDER — ONDANSETRON HCL 4 MG/2ML IJ SOLN
INTRAMUSCULAR | Status: DC | PRN
  Administered 2020-10-02: 4 mg via INTRAVENOUS

## 2020-10-02 MED ORDER — FENTANYL CITRATE (PF) 250 MCG/5ML IJ SOLN
INTRAMUSCULAR | Status: AC
  Filled 2020-10-02: qty 5

## 2020-10-02 MED ORDER — PROPOFOL 10 MG/ML IV BOLUS
INTRAVENOUS | Status: DC | PRN
  Administered 2020-10-02: 200 mg via INTRAVENOUS

## 2020-10-02 MED ORDER — ACETAMINOPHEN 10 MG/ML IV SOLN
INTRAVENOUS | Status: AC
  Filled 2020-10-02: qty 100

## 2020-10-02 MED ORDER — IBUPROFEN 600 MG PO TABS
600.0000 mg | ORAL_TABLET | Freq: Four times a day (QID) | ORAL | Status: DC
  Administered 2020-10-03 – 2020-10-05 (×8): 600 mg via ORAL
  Filled 2020-10-02 (×8): qty 1

## 2020-10-02 MED ORDER — SENNOSIDES-DOCUSATE SODIUM 8.6-50 MG PO TABS
2.0000 | ORAL_TABLET | ORAL | Status: DC
  Administered 2020-10-02 – 2020-10-05 (×4): 2 via ORAL
  Filled 2020-10-02 (×4): qty 2

## 2020-10-02 MED ORDER — CEFAZOLIN SODIUM-DEXTROSE 2-3 GM-%(50ML) IV SOLR
INTRAVENOUS | Status: DC | PRN
  Administered 2020-10-02: 2 g via INTRAVENOUS

## 2020-10-02 MED ORDER — TERBUTALINE SULFATE 1 MG/ML IJ SOLN: 0.2500 mg | Freq: Once | INTRAMUSCULAR | Status: AC

## 2020-10-02 MED ORDER — OXYTOCIN-SODIUM CHLORIDE 30-0.9 UT/500ML-% IV SOLN
INTRAVENOUS | Status: DC | PRN
  Administered 2020-10-02: 200 mL via INTRAVENOUS

## 2020-10-02 MED ORDER — KETOROLAC TROMETHAMINE 30 MG/ML IJ SOLN
30.0000 mg | Freq: Four times a day (QID) | INTRAMUSCULAR | Status: AC
  Administered 2020-10-02 – 2020-10-03 (×3): 30 mg via INTRAVENOUS
  Filled 2020-10-02 (×3): qty 1

## 2020-10-02 MED ORDER — ZOLPIDEM TARTRATE 5 MG PO TABS: 5.0000 mg | ORAL_TABLET | Freq: Every evening | ORAL | Status: DC | PRN

## 2020-10-02 MED ORDER — ACETAMINOPHEN 10 MG/ML IV SOLN
1000.0000 mg | Freq: Once | INTRAVENOUS | Status: DC | PRN
  Administered 2020-10-02: 1000 mg via INTRAVENOUS

## 2020-10-02 MED ORDER — FENTANYL CITRATE (PF) 100 MCG/2ML IJ SOLN
INTRAMUSCULAR | Status: DC | PRN
  Administered 2020-10-02: 200 ug via INTRAVENOUS
  Administered 2020-10-02 (×3): 50 ug via INTRAVENOUS

## 2020-10-02 MED ORDER — OXYCODONE HCL 5 MG/5ML PO SOLN: 5.0000 mg | Freq: Once | ORAL | Status: DC | PRN

## 2020-10-02 MED ORDER — PROPOFOL 10 MG/ML IV BOLUS
INTRAVENOUS | Status: AC
  Filled 2020-10-02: qty 20

## 2020-10-02 MED ORDER — OXYCODONE HCL 5 MG PO TABS: 5.0000 mg | ORAL_TABLET | Freq: Once | ORAL | Status: DC | PRN

## 2020-10-02 MED ORDER — SUCCINYLCHOLINE CHLORIDE 200 MG/10ML IV SOSY
PREFILLED_SYRINGE | INTRAVENOUS | Status: AC
  Filled 2020-10-02: qty 10

## 2020-10-02 MED ORDER — SUCCINYLCHOLINE CHLORIDE 20 MG/ML IJ SOLN: INTRAMUSCULAR | Status: DC | PRN

## 2020-10-02 MED ORDER — DOCUSATE SODIUM 100 MG PO CAPS: 100.0000 mg | ORAL_CAPSULE | Freq: Every day | ORAL | Status: DC

## 2020-10-02 MED ORDER — SIMETHICONE 80 MG PO CHEW
80.0000 mg | CHEWABLE_TABLET | Freq: Three times a day (TID) | ORAL | Status: DC
  Administered 2020-10-02 – 2020-10-05 (×8): 80 mg via ORAL
  Filled 2020-10-02 (×8): qty 1

## 2020-10-02 MED ORDER — MAGNESIUM SULFATE BOLUS VIA INFUSION
2.0000 g | Freq: Once | INTRAVENOUS | Status: AC
  Administered 2020-10-02: 2 g via INTRAVENOUS
  Filled 2020-10-02: qty 1000

## 2020-10-02 MED ORDER — WITCH HAZEL-GLYCERIN EX PADS
1.0000 | MEDICATED_PAD | CUTANEOUS | Status: DC | PRN
Start: 2020-10-02 — End: 2020-10-05

## 2020-10-02 MED ORDER — DIBUCAINE (PERIANAL) 1 % EX OINT
1.0000 | TOPICAL_OINTMENT | CUTANEOUS | Status: DC | PRN
Start: 2020-10-02 — End: 2020-10-05

## 2020-10-02 MED ORDER — LIDOCAINE HCL (CARDIAC) PF 100 MG/5ML IV SOSY
PREFILLED_SYRINGE | INTRAVENOUS | Status: DC | PRN
  Administered 2020-10-02: 25 mg via INTRAVENOUS

## 2020-10-02 MED ORDER — PROMETHAZINE HCL 25 MG/ML IJ SOLN: 6.2500 mg | INTRAMUSCULAR | Status: DC | PRN

## 2020-10-02 SURGICAL SUPPLY — 30 items
BENZOIN TINCTURE PRP APPL 2/3 (GAUZE/BANDAGES/DRESSINGS) ×2 IMPLANT
CLAMP CORD UMBIL (MISCELLANEOUS) ×2 IMPLANT
CLOTH BEACON ORANGE TIMEOUT ST (SAFETY) ×2 IMPLANT
DRSG OPSITE POSTOP 4X10 (GAUZE/BANDAGES/DRESSINGS) ×2 IMPLANT
ELECT REM PT RETURN 9FT ADLT (ELECTROSURGICAL) ×2
ELECTRODE REM PT RTRN 9FT ADLT (ELECTROSURGICAL) ×1 IMPLANT
EXTRACTOR VACUUM M CUP 4 TUBE (SUCTIONS) IMPLANT
GLOVE BIOGEL PI IND STRL 7.0 (GLOVE) ×3 IMPLANT
GLOVE BIOGEL PI INDICATOR 7.0 (GLOVE) ×3
GLOVE ECLIPSE 7.0 STRL STRAW (GLOVE) ×2 IMPLANT
GOWN STRL REUS W/TWL LRG LVL3 (GOWN DISPOSABLE) ×4 IMPLANT
KIT ABG SYR 3ML LUER SLIP (SYRINGE) ×2 IMPLANT
NEEDLE HYPO 22GX1.5 SAFETY (NEEDLE) ×2 IMPLANT
NEEDLE HYPO 25X5/8 SAFETYGLIDE (NEEDLE) ×2 IMPLANT
NS IRRIG 1000ML POUR BTL (IV SOLUTION) ×2 IMPLANT
PACK C SECTION WH (CUSTOM PROCEDURE TRAY) ×2 IMPLANT
PAD ABD 7.5X8 STRL (GAUZE/BANDAGES/DRESSINGS) ×2 IMPLANT
PAD OB MATERNITY 4.3X12.25 (PERSONAL CARE ITEMS) ×2 IMPLANT
PENCIL SMOKE EVAC W/HOLSTER (ELECTROSURGICAL) ×2 IMPLANT
RTRCTR C-SECT PINK 25CM LRG (MISCELLANEOUS) ×2 IMPLANT
STRIP CLOSURE SKIN 1/2X4 (GAUZE/BANDAGES/DRESSINGS) ×2 IMPLANT
SUT MNCRL 0 VIOLET CTX 36 (SUTURE) ×2 IMPLANT
SUT MONOCRYL 0 CTX 36 (SUTURE) ×2
SUT VIC AB 0 CTX 36 (SUTURE) ×1
SUT VIC AB 0 CTX36XBRD ANBCTRL (SUTURE) ×1 IMPLANT
SUT VIC AB 4-0 KS 27 (SUTURE) ×2 IMPLANT
SYR 30ML LL (SYRINGE) ×2 IMPLANT
TOWEL OR 17X24 6PK STRL BLUE (TOWEL DISPOSABLE) ×2 IMPLANT
TRAY FOLEY W/BAG SLVR 14FR LF (SET/KITS/TRAYS/PACK) ×2 IMPLANT
WATER STERILE IRR 1000ML POUR (IV SOLUTION) ×2 IMPLANT

## 2020-10-02 NOTE — [facility] Note (Signed)
Dr. Despina Hidden at bedside with bedside ultrasound.    Allison Boone

## 2020-10-02 NOTE — Transfer of Care (Signed)
Immediate Anesthesia Transfer of Care Note  Patient: Allison Boone  Procedure(s) Performed: CESAREAN SECTION  Patient Location: PACU  Anesthesia Type:General  Level of Consciousness: awake, alert  and oriented  Airway & Oxygen Therapy: Patient Spontanous Breathing and Patient connected to nasal cannula oxygen  Post-op Assessment: Report given to RN and Post -op Vital signs reviewed and stable  Post vital signs: Reviewed and stable  Last Vitals:  Vitals Value Taken Time  BP    Temp    Pulse    Resp    SpO2      Last Pain:  Vitals:   10/02/20 0841  TempSrc:   PainSc: 2       Patients Stated Pain Goal: 2 (10/02/20 0757)  Complications: No notable events documented.

## 2020-10-02 NOTE — Discharge Summary (Signed)
Postpartum Discharge Summary       Patient Name: Allison Boone DOB: 20-Sep-1985 MRN: [id number]  Date of admission: 10/01/2020 Delivery date:10/02/2020  Delivering provider: Donnamae Jude  Date of discharge: 10/05/2020  Admitting diagnosis: Preterm delivery [O60.10X0] Intrauterine pregnancy: [redacted]w[redacted]d    Secondary diagnosis:  Active Problems:   Supervision of high risk pregnancy, antepartum   History of stillbirth   History of cesarean delivery, currently pregnant   History of preterm delivery   Unwanted fertility   Severe preeclampsia   Preterm delivery  Additional problems: none    Discharge diagnosis: Preterm Pregnancy Delivered, Preeclampsia (severe), and Anemia                                              Post partum procedures: Augmentation: N/A Complications: Placental Abruption  Hospital course: Sceduled C/S   35y.o. yo [phone] admitted to the hospital 10/01/2020 for scheduled cesarean section with the following indication:Non-Reassuring FHR.Delivery details are as follows:  Membrane Rupture Time/Date: 10:09 AM ,10/02/2020   Delivery Method:C-Section, Low Transverse  Details of operation can be found in separate operative note.  Patient had an uncomplicated postpartum course.  She is ambulating, tolerating a regular diet, passing flatus, and urinating well. Patient is discharged home in stable condition on  10/05/20        Newborn Data: Birth date:10/02/2020  Birth time:10:09 AM  Gender:Female  Living status:Living  Apgars:4 ,8  Weight:1220 g     Magnesium Sulfate received: Yes: Neuroprotection BMZ received: Yesx1 Rhophylac:N/A MMR:N/A T-DaP:Given prenatally Flu: No Transfusion:No  Physical exam  Vitals:   10/04/20 0830 10/04/20 1750 10/04/20 2141 10/05/20 0507  BP: 125/78 132/74 116/72 124/74  Pulse: 64 69 72 66  Resp: _0 Temp: 98.1 F (36.7 C) 98 F (36.7 C) 98.4 F (36.9 C) 98.4 F (36.9 C)  TempSrc: Oral Oral Oral Oral   SpO2: 92% 98% 95% 100%  Weight:      Height:       General: alert, cooperative, and no distress Lochia: appropriate Uterine Fundus: firm Incision: Healing well with no significant drainage DVT Evaluation: No evidence of DVT seen on physical exam. Labs: Lab Results  Component Value Date   WBC 12.7 (H) 10/03/2020   HGB 8.6 (L) 10/03/2020   HCT 26.7 (L) 10/03/2020   MCV 73.4 (L) 10/03/2020   PLT 228 10/03/2020   CMP Latest Ref Rng & Units 10/01/2020  Glucose 70 - 99 mg/dL 89  BUN 6 - 20 mg/dL 12  Creatinine 0.44 - 1.00 mg/dL 0.80  Sodium 135 - 145 mmol/L 134(L)  Potassium 3.5 - 5.1 mmol/L 3.6  Chloride 98 - 111 mmol/L 107  CO2 22 - 32 mmol/L 19(L)  Calcium 8.9 - 10.3 mg/dL 8.0(L)  Total Protein 6.5 - 8.1 g/dL 5.5(L)  Total Bilirubin 0.3 - 1.2 mg/dL 0.5  Alkaline Phos 38 - 126 U/L 69  AST 15 - 41 U/L 31  ALT 0 - 44 U/L 27   Edinburgh Score: Edinburgh Postnatal Depression Scale Screening Tool 10/03/2020  I have been able to laugh and see the funny side of things. 0  I have looked forward with enjoyment to things. 0  I have blamed myself unnecessarily when things went wrong. 1  I have been anxious or worried for no good reason. 1  I have felt scared or panicky for no good reason. 1  Things have been getting on top of me. 1  I have been so unhappy that I have had difficulty sleeping. 0  I have felt sad or miserable. 1  I have been so unhappy that I have been crying. 1  The thought of harming myself has occurred to me. 0  Edinburgh Postnatal Depression Scale Total 6     After visit meds:  Allergies as of 10/05/2020   No Known Allergies      Medication List     STOP taking these medications    Gojji Weight Scale Misc       TAKE these medications    Blood Pressure Monitor Automat Devi 1 Device by Does not apply route daily. Automatic blood pressure cuff regular size. To monitor blood pressure regularly at home. ICD-10 code:Z34.90   ferrous sulfate 325 (65  FE) MG tablet Take 1 tablet (325 mg total) by mouth every other day.   ibuprofen 600 MG tablet Commonly known as: ADVIL Take 1 tablet (600 mg total) by mouth every 6 (six) hours.   multivitamin-prenatal 27-0.8 MG Tabs tablet Take 1 tablet by mouth daily at 12 noon.   oxyCODONE 5 MG immediate release tablet Commonly known as: Oxy IR/ROXICODONE Take 1-2 tablets (5-10 mg total) by mouth every 4 (four) hours as needed for moderate pain.               Discharge Care Instructions  (From admission, onward)           Start     Ordered   10/05/20 0000  Leave dressing on - Keep it clean, dry, and intact until clinic visit        10/05/20 0736             Discharge home in stable condition Infant Feeding: Breast Infant Disposition:NICU Discharge instruction: per After Visit Summary and Postpartum booklet. Activity: Advance as tolerated. Pelvic rest for 6 weeks.  Diet: routine diet Future Appointments: Future Appointments  Date Time Provider [place]  10/11/2020  1:20 PM [place] None  11/01/2020  3:30 PM Constant, Vickii Chafe, MD [place] None   Follow up Visit:  [place] for [place] at [facility] Follow up in 1 week(s).   Specialty: Obstetrics and Gynecology Why: BP check, For wound re-check Contact information: [place] [id number] [phone]               Message sent to [facility] 10/02/20 by Sylvester Harder.   Please schedule this patient for a In person postpartum visit in 6 weeks with the following provider: Any provider. Additional Postpartum F/U:Incision check 1 week and BP check 1 week  High risk pregnancy complicated by: HTN Delivery mode:  C-Section, Low Transverse  Anticipated Birth Control:  Unsure interval BTL   10/05/2020 Florian Buff, MD

## 2020-10-02 NOTE — Anesthesia Postprocedure Evaluation (Signed)
Anesthesia Post Note  Patient: Allison Boone  Procedure(s) Performed: CESAREAN SECTION     Patient location during evaluation: PACU Level of consciousness: awake and alert Pain management: pain level controlled Vital Signs Assessment: post-procedure vital signs reviewed and stable Respiratory status: spontaneous breathing, nonlabored ventilation and respiratory function stable Cardiovascular status: blood pressure returned to baseline and stable Postop Assessment: no apparent nausea or vomiting Anesthetic complications: no   No notable events documented.  Last Vitals:  Vitals:   10/02/20 1411 10/02/20 1541  BP: 122/74 114/75  Pulse: 68 71  Resp:  16  Temp:  36.4 C  SpO2: 95% 95%    Last Pain:  Vitals:   10/02/20 1541  TempSrc: Oral  PainSc:    Pain Goal: Patients Stated Pain Goal: 2 (10/02/20 0757)                 [name]

## 2020-10-02 NOTE — Anesthesia Procedure Notes (Signed)
Procedure Name: Intubation Date/Time: 10/02/2020 10:08 AM Performed by: Rica Records, CRNA Pre-anesthesia Checklist: Patient identified, Suction available, Patient being monitored, Timeout performed and Emergency Drugs available Patient Re-evaluated:Patient Re-evaluated prior to induction Oxygen Delivery Method: Circle system utilized Preoxygenation: Pre-oxygenation with 100% oxygen Induction Type: IV induction, Rapid sequence and Cricoid Pressure applied Laryngoscope Size: Glidescope and 3 Grade View: Grade I Tube type: Oral Tube size: 7.0 mm Number of attempts: 1 Airway Equipment and Method: Oral airway Placement Confirmation: ETT inserted through vocal cords under direct vision, positive ETCO2 and breath sounds checked- equal and bilateral Secured at: 22 cm Dental Injury: Teeth and Oropharynx as per pre-operative assessment

## 2020-10-02 NOTE — Anesthesia Preprocedure Evaluation (Signed)
Anesthesia Evaluation   Patient awake  Preop documentation limited or incomplete due to emergent nature of procedure.  History of Anesthesia Complications Negative for: history of anesthetic complications  Airway        Dental   Pulmonary           Cardiovascular hypertension (pre eclampsia with severe features),      Neuro/Psych    GI/Hepatic   Endo/Other    Renal/GU      Musculoskeletal   Abdominal   Peds  Hematology        Component                Value               Date/Time                 HGB                      10.7 (L)            10/01/2020 2347           HGB                      11.2                09/30/2020 1103           HCT                      33.9 (L)            10/01/2020 2347           HCT                      35.6                09/30/2020 1103           PLT                      250                 10/01/2020 2347           PLT                      257                 09/30/2020 1103        Anesthesia Other Findings To OR from [facility] specialty care STAT for fetal bradycardia  Reproductive/Obstetrics (+) Pregnancy [id number] at [redacted]w[redacted]d                             Anesthesia Physical Anesthesia Plan  ASA: 3 and emergent  Anesthesia Plan: General ETT, Rapid Sequence and Cricoid Pressure   Post-op Pain Management:    Induction:   PONV Risk Score and Plan: Treatment may vary due to age or medical condition, Ondansetron, Dexamethasone and Midazolam  Airway Management Planned:   Additional Equipment:   Intra-op Plan:   Post-operative Plan:   Informed Consent:     Only emergency history available  Plan Discussed with:   Anesthesia Plan Comments:         Anesthesia Quick Evaluation

## 2020-10-02 NOTE — H&P (Addendum)
Allison Boone is a 35 y.o. female [phone] at [redacted]w[redacted]d with poor obstetric hx including stillbirth x 2 via vaginal delivery and preterm delivery via C/S at 32 weeks presenting for elevated blood pressure and headache. She has new onset HTN in pregnancy with first elevated BP at home on 09/29/20, follow up in the office with elevated BP of 140s to 150s/90s, no severe range, and BP elevated at MFM [place] on 10/01/20. Preeclampsia labs were ordered.  She presents tonight with headache, frontal, constant, onset 4-5 hours before arrival in MAU. There are no visual disturbances and there is no epigastric pain.    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  TDaP Vaccine   Received 09/22/20 Hgb A1C or  GTT Early  Third trimester normal  COVID Vaccine    LAB RESULTS   Rhogam  n/a Blood Type O/Positive/-- (03/14 1108)   Baby Feeding Plan Breast Antibody Negative (03/14 1108)  Contraception BTL Rubella 1.86 (03/14 1108)  Circumcision Yes RPR Non Reactive (05/25 1135)   Pediatrician  Info given HBsAg Negative (03/14 1108)   Support Person FOB HCVAb negative  Prenatal Classes N/A HIV Non Reactive (05/25 1135)     BTL Consent  09/08/20 GBS   (For PCN allergy, check sensitivities)   VBAC Consent 09/08/2020 Pap 07/14/20       BP Cuff Rx [facility] 06/28/2020 Waterbirth  [ ]  Class [ ]  Consent [ ]  CNM visit  Weight Scale Rx [facility] 06/28/20 Induction  [ ]  Orders Entered [ ] Foley Y/N   OB History     Gravida  4   Para  3   Term      Preterm  3   AB  0   Living  1      SAB  0   IAB      Ectopic      Multiple      Live Births  1          Patient Active Problem List   Diagnosis Date Noted   Unwanted  fertility 09/22/2020   Supervision of high risk pregnancy, antepartum 06/28/2020   History of stillbirth 06/28/2020   History of cesarean delivery, currently pregnant 06/28/2020   History of preterm delivery 06/28/2020    Past Medical History:  Diagnosis Date   Anxiety    [employer] 06/22/2020   Depression    [employer] 06/22/2020   Past Surgical History:  Procedure Laterality Date   CESAREAN SECTION N/A    [employer] 06/22/2020   Family History: family history is not on file. Social History:  reports that she has never smoked. She has never used smokeless tobacco. She reports that she does not drink alcohol and does not use drugs.     Maternal Diabetes: No Genetic Screening: Normal Maternal Ultrasounds/Referrals: Normal Fetal Ultrasounds or other Referrals:  None Maternal Substance Abuse:  No Significant Maternal Medications:  None Significant Maternal Lab Results:  Other:  Other Comments:   GBS unknown  Review of Systems Maternal Medical History:  Reason for admission: Severe preeclampsia at 29 weeks   Contractions: Frequency: rare.   Fetal activity:  Perceived fetal activity is normal.   Prenatal complications: None this pregnancy, hx stillbirth x 2 with vaginal delivery and preterm delivery by cesarean  Prenatal Complications - Diabetes: none.    Blood pressure (!) 177/97, pulse (!) 59, temperature 98.7 F (37.1 C), temperature source Oral, resp. rate 15, height 4\' 11"  (1.499 m), weight 62.6 kg, last menstrual period 03/09/2020, SpO2 98 %. Maternal Exam:  Uterine Assessment: Contraction strength is mild.  Cervix: not evaluated.   Fetal Exam Fetal Monitor Review: Mode: ultrasound.   Variability: minimal (<5 bpm).   Pattern: no accelerations and late decelerations.   Fetal State Assessment: Category II - tracings are indeterminate.  Physical Exam Vitals and nursing note reviewed.  Constitutional:      Appearance: She is well-developed.  Cardiovascular:     Rate  and Rhythm: Normal rate and regular rhythm.  Pulmonary:     Effort: Pulmonary effort is normal.     Breath sounds: Normal breath sounds.  Abdominal:     Palpations: Abdomen is soft.  Musculoskeletal:        General: Normal range of motion.     Cervical back: Normal range of motion.  Skin:    General: Skin is warm and dry.  Neurological:     Mental Status: She is alert and oriented to person, place, and time.  Psychiatric:        Behavior: Behavior normal.        Thought Content: Thought content normal.        Judgment: Judgment normal.    Prenatal labs: ABO, Rh: O/Positive/-- (03/14 1108) Antibody: Negative (03/14 1108) Rubella: 1.86 (03/14 1108) RPR: Non Reactive (05/25 1135)  HBsAg: Negative (03/14 1108)  HIV: Non Reactive (05/25 1135)  GBS:   Unknown  Assessment/Plan: 08-15-2002 at [redacted]w[redacted]d  with preeclampsia with severe features based on severe range BPs and headache GBS unknown  Admit to HROB Unit IV fluid bolus Preeclampsia protocol with IV labetalol then hydralazine ordered Betamethasone x 2 doses Q 24 hours Magnesium sulfate 4 g bolus then 2g/hour NICU notified  [redacted]w[redacted]d 10/02/2020, 12:03 AM

## 2020-10-02 NOTE — Op Note (Signed)
Cesarean Section Operative Note  Preoperative Diagnosis:  IUP @ [redacted]w[redacted]d, non-reassuring fetal heart tones, severe pre-eclampsia  Postoperative Diagnosis:  Same  Procedure: repeat low transverse cesarean section  Surgeon: Tinnie Gens, M.D.  Assistant: Mart Piggs, MD  Findings: Viable female infant, APGAR (1 MIN): 4   APGAR (5 MINS): 8   Weight pending, cephalic presentation  Estimated blood loss: 227 cc  Urine output: 50 cc clear urine  Intake: 1600 cc lactated ringers  Complications: None  Specimens: Placenta to pathology for suspected placental abruption and preterm delivery  Reason for procedure: Briefly, the patient is a 35 y.o. [id number] [redacted]w[redacted]d who presents for elevated blood pressure and headache, who meets diagnostic criteria for severe pre-eclampsia. After admission concern for non reassuring fetal heart tones with recurrent prolonged decelerations, indication for repeat cesarean section.  Procedure: Patient is a to the OR where general anesthesia was administered. She was then placed in a supine position with left lateral tilt. She received 2 g of Ancef and SCDs were in place. A timeout was performed. She was prepped and draped in the usual sterile fashion. A Foley catheter was placed in the bladder. A knife was then used to make a Pfannenstiel incision over her preexisting scar. This incision was carried out to underlying fascia which was divided in the midline with the knife. The incision was extended laterally, bluntly.  The rectus was divided in the midline.  The peritoneal cavity was entered bluntly.  Alexis retractor was placed inside the incision.  A knife was used to make a low transverse incision on the uterus. This incision was carried down to the amniotic cavity was entered. Fetus was in cephalic presentation and was brought up out of the incision without difficulty. Cord was clamped x 2 and cut immediately. Infant taken to waiting nurse.  Arterial cord gas and cord  blood was obtained. Placenta was delivered from the uterus.  Uterus was cleaned with dry lap pads. Uterine incision closed with 0 Monocryl suture in a locked running fashion. A second layer of 0 Monocryl in an imbricating fashion was used to achieve hemostasis. Alexis retractor was removed from the abdomen. Peritoneal closure was done with 0 Monocryl suture.  Fascia is closed with 0 Vicryl suture in a running fashion. Subcutaneous tissue infused with 30cc 0.25% Marcaine. Skin closed using 3-0 Vicryl on a Keith needle.  Steri strips applied, followed by pressure dressing.  All instrument, needle and lap counts were correct x 2.  Patient was awake and taken to PACU stable.  Infant remained with mom in couplet care, stable.   Broadus John FirestoneMD 10/02/2020 10:55 AM

## 2020-10-02 NOTE — Lactation Note (Signed)
This note was copied from a baby's chart. Lactation Consultation Note  Patient Name: Allison Boone YIRSW'N Date: 10/02/2020 Reason for consult: Initial assessment;NICU baby;Preterm <34wks;Other (Comment);Infant < 6lbs (AMA) Age:35 hours  Visited with mom of 12 hours old pre-term NICU female < 3 lbs, she has 2 living children and reported (++) breast changes during the pregnancy. Mom can't hear very well, staff working with her needs to get closer and speak a bit louder.   She has started pumping already and getting some drops of colostrum, praised her for her efforts. LC assisted with hand expression and she was able to get colostrum out of both breasts. Noticed some areolar edema, mom has flat nipples.  Reviewed pumping schedule, pumping log, lactogenesis II and benefits of breastmilk for NICU babies.  Feeding plan:  Encouraged mom pump every 2-3 hours, ideally 8 pumping sessions/24 hours Breast massage and hand expression were also encouraged prior pumping  BF brochure, BF resources and NICU booklet were reviewed. No support person at the time of [facility] consultation. Mom reported all questions and concerns were answered, she's aware of LC OP services and will call PRN.  Maternal Data Has patient been taught Hand Expression?: Yes Does the patient have breastfeeding experience prior to this delivery?: Yes How long did the patient breastfeed?: only pumped for a few weeks with one of her living children  Feeding Mother's Current Feeding Choice: Breast Milk  LATCH Score                    Lactation Tools Discussed/Used Tools: Pump;Flanges Flange Size: 24 Breast pump type: Double-Electric Breast Pump Pump Education: Setup, frequency, and cleaning;Milk Storage Reason for Pumping: preterm infant in NICU < 3 lbs Pumping frequency: q 3 hours Pumped volume: 0 mL (drops)  Interventions Interventions: Breast feeding basics reviewed;DEBP;Education;Hand express;Breast  massage  Discharge Pump: Personal (mom already ordered a DEBP through her insurance, but she's not sure when it will arrive) [facility] Program: Yes  Consult Status Consult Status: Follow-up Date: 10/03/20 Follow-up type: In-patient    Allison Boone Allison Boone 10/02/2020, 10:11 PM

## 2020-10-02 NOTE — Progress Notes (Signed)
Patient ID: Allison Boone, female   DOB: [birth year], 35 y.o.   MRN: [id number] FACULTY PRACTICE ANTEPARTUM(COMPREHENSIVE) NOTE  Allison Boone is a 35 y.o. [phone] with Estimated Date of Delivery: 12/14/20   By  early ultrasound [redacted]w[redacted]d  who is admitted for severe pre eclampsia, BP, fetal decelerations.    Fetal presentation is cephalic.by my bedside sonogram at 0100 Length of Stay:  0  Days  Date of admission:10/01/2020  Subjective: Pt is without complaints except feeling weak from the magnesium Patient reports the fetal movement as decreased . Patient reports uterine contraction  activity as none. Patient reports  vaginal bleeding as none. Patient describes fluid per vagina as None.  Vitals:  Blood pressure 112/71, pulse 84, temperature 97.6 F (36.4 C), temperature source Oral, resp. rate 17, height 4\' 11"  (1.499 m), weight 62.6 kg, last menstrual period 03/09/2020, SpO2 94 %. Vitals:   10/02/20 0700 10/02/20 0801 10/02/20 0832 10/02/20 0900  BP: 118/75 113/68  112/71  Pulse: 79 78  84  Resp:  18  17  Temp:   97.6 F (36.4 C)   TempSrc:   Oral   SpO2: 94%   94%  Weight:      Height:       Physical Examination:  General appearance - alert, well appearing, and in no distress Abdomen - soft, nontender, nondistended, no masses or organomegaly Fundal Height:  size equals dates Pelvic Exam:  examination not indicated Cervical Exam: Not evaluated. . Extremities: extremities normal, atraumatic, no cyanosis or edema with DTRs 2+ bilaterally Membranes:intact  Fetal Monitoring:  Baseline: 120s bpm, Variability: minimal occasional, Accelerations: non reactive, and Decelerations: has 3-4 per hour associated with contractions      On magnesium and certainly concerned about thew FHR strip At this gestation trying to decrease contractions to minimize the FHR decelerations, suspect occult abruption as source Dr 10/04/20 is aware and agrees with concern over the tracing, anticipate a bradycardic  episode and if that happens proceed with stat c section  I informed patient of all of tha above as well  Labs:  Results for orders placed or performed during the hospital encounter of 10/01/20 (from the past 24 hour(s))  Protein / creatinine ratio, urine   Collection Time: 10/01/20 11:32 PM  Result Value Ref Range   Creatinine, Urine 181.71 mg/dL   Total Protein, Urine 874 mg/dL   Protein Creatinine Ratio 4.81 (H) 0.00 - 0.15 mg/mg[Cre]  CBC   Collection Time: 10/01/20 11:47 PM  Result Value Ref Range   WBC 6.9 4.0 - 10.5 K/uL   RBC 4.58 3.87 - 5.11 MIL/uL   Hemoglobin 10.7 (L) 12.0 - 15.0 g/dL   HCT 10/03/20 (L) 13.2 - 44.0 %   MCV 74.0 (L) 80.0 - 100.0 fL   MCH 23.4 (L) 26.0 - 34.0 pg   MCHC 31.6 30.0 - 36.0 g/dL   RDW 10.2 72.5 - 36.6 %   Platelets 250 150 - 400 K/uL   nRBC 0.0 0.0 - 0.2 %  Comprehensive metabolic panel   Collection Time: 10/01/20 11:47 PM  Result Value Ref Range   Sodium 134 (L) 135 - 145 mmol/L   Potassium 3.6 3.5 - 5.1 mmol/L   Chloride 107 98 - 111 mmol/L   CO2 19 (L) 22 - 32 mmol/L   Glucose, Bld 89 70 - 99 mg/dL   BUN 12 6 - 20 mg/dL   Creatinine, Ser 10/03/20 0.44 - 1.00 mg/dL   Calcium 8.0 (L) 8.9 - 10.3 mg/dL  Total Protein 5.5 (L) 6.5 - 8.1 g/dL   Albumin 2.1 (L) 3.5 - 5.0 g/dL   AST 31 15 - 41 U/L   ALT 27 0 - 44 U/L   Alkaline Phosphatase 69 38 - 126 U/L   Total Bilirubin 0.5 0.3 - 1.2 mg/dL   GFR, Estimated >95 >28 mL/min   Anion gap 8 5 - 15  Type and screen [facility]   Collection Time: 10/01/20 11:59 PM  Result Value Ref Range   ABO/RH(D) O POS    Antibody Screen NEG    Sample Expiration      10/04/2020,2359 Performed at [facility], [address] [place], [place] [address]   Resp Panel by RT-PCR (Flu A&B, Covid) Nasopharyngeal Swab   Collection Time: 10/02/20 12:21 AM   Specimen: Nasopharyngeal Swab; Nasopharyngeal(NP) swabs in vial transport medium  Result Value Ref Range   SARS Coronavirus 2 by RT PCR  NEGATIVE NEGATIVE   Influenza A by PCR NEGATIVE NEGATIVE   Influenza B by PCR NEGATIVE NEGATIVE    Imaging Studies:    [place] MFM OB FOLLOW UP  Result Date: 10/01/2020 ----------------------------------------------------------------------  OBSTETRICS REPORT                       (Signed Final 10/01/2020 04:44 pm) ---------------------------------------------------------------------- Patient Info  ID #:       [id number]                          D.O.B.:  01-13-1986 (35 yrs)  Name:       Allison Boone                  Visit Date: 10/01/2020 11:42 am ---------------------------------------------------------------------- Performed By  Attending:        Ma Rings MD         Ref. Address:     [address]                                                             [place], [place]                                                             [address]  Performed By:     Tommie Raymond BS,       Location[facility]                    RDMS, RVT  [facility]  Referred By:      Adam Phenix                    MD ---------------------------------------------------------------------- Orders  #  Description                           Code        Ordered By  1  [place] MFM OB FOLLOW UP                   [phone]    Rosana Hoes ----------------------------------------------------------------------  #  Order #                     Accession #                Episode #  1  [id number]                   [id number]                 [id number] ---------------------------------------------------------------------- Indications  Poor obstetric history: Previous IUFD          O09.299  (stillbirth) x 2  [redacted] weeks gestation of pregnancy                Z3A.42  Advanced maternal age multigravida 26+,         O41.523  third trimester  Poor obstetric history (prior pre-term labor)  O09.219  LR NIPS, AFP - neg.  Encounter for other antenatal screening        Z36.2  follow-up ---------------------------------------------------------------------- Fetal Evaluation  Num Of Fetuses:         1  Fetal Heart Rate(bpm):  136  Cardiac Activity:       Observed  Presentation:           Cephalic  Placenta:               Posterior Fundal  P. Cord Insertion:      Previously Visualized  Amniotic Fluid  AFI FV:      Within normal limits  AFI Sum(cm)     %Tile       Largest Pocket(cm)  13.8            44          5.8  RUQ(cm)       RLQ(cm)       LUQ(cm)        LLQ(cm)  2.6           2.9           2.6            5.8 ---------------------------------------------------------------------- Biometry  BPD:      70.3  mm     G. Age:  28w 2d          9  %    CI:        73.08   %    70 - 86                                                          FL/HC:      20.1   %    19.6 - 20.8  HC:      261.4  mm     G. Age:  28w 3d        3.6  %    HC/AC:      1.04        0.99 - 1.21  AC:      250.6  mm     G. Age:  29w 2d         39  %    FL/BPD:     74.8   %    71 - 87  FL:       52.6  mm     G. Age:  28w 0d          7  %    FL/AC:      21.0   %    20 - 24  CER:      36.6  mm     G. Age:  30w 3d         72  %  LV:        5.1  mm  Est. FW:    1269  gm    2 lb 13 oz      16  % ---------------------------------------------------------------------- OB History  Gravidity:    4         Prem:   3  Living:       1 ---------------------------------------------------------------------- Gestational Age  LMP:           29w 3d        Date:  03/09/20                 EDD:   12/14/20  U/S Today:     28w 4d                                        EDD:   12/20/20  Best:          29w 3d     Det. By:  LMP  (03/09/20)          EDD:   12/14/20 ---------------------------------------------------------------------- Anatomy  Cranium:               Appears normal         LVOT:                    Appears normal  Cavum:                 Appears normal         Aortic Arch:            Appears normal  Ventricles:  Appears normal         Ductal Arch:            Previously seen  Choroid Plexus:        Appears normal         Diaphragm:              Appears normal  Cerebellum:            Appears normal         Stomach:                Appears normal, left                                                                        sided  Posterior Fossa:       Previously seen        Abdomen:                Previously seen  Nuchal Fold:           Not applicable (>20    Abdominal Wall:         Previously seen                         wks GA)  Face:                  Orbits and profile     Cord Vessels:           Previously seen                         previously seen  Lips:                  Appears normal         Kidneys:                Appear normal  Palate:                Appears normal         Bladder:                Appears normal  Thoracic:              Previously seen        Spine:                  Previously seen  Heart:                 Previously seen        Upper Extremities:      Previously seen  RVOT:                  Appears normal         Lower Extremities:      Previously seen  Other:  Female gender previously seen. Nasal bone, Heels/feet and open          hands/5th digits,  Lenses, VC, 3VV and 3VTV prev. visualized.          Technically difficult due to advanced GA and fetal position. ---------------------------------------------------------------------- Cervix Uterus Adnexa  Cervix  Length:           3.83  cm.  Not visualized (advanced GA >24wks)  Uterus  No abnormality visualized.  Right Ovary  Not visualized.  Left Ovary  Not visualized.  Cul De Sac  No free fluid seen.  Adnexa  No abnormality visualized. ---------------------------------------------------------------------- Comments  This patient was seen for a follow up exam due to her prior  history of two prior fetal demises.  The  patient's blood  pressures in our office were 158/90, 158/88, and 145/82.  She denies any history of hypertension.  She reports feeling  fetal movements throughout the day and denies any signs or  symptoms associated with preeclampsia.  She was informed that the fetal growth and amniotic fluid  level appears appropriate for her gestational age.  The patient had a reactive nonstress test for her gestational  age following today's ultrasound exam.  Due to her history of prior IUFD's, we will continue weekly  NSTs/fetal testing until delivery.  She will return in 1 week for another NST.  We will reassess  the fetal growth again in 4 weeks.  Should her blood pressures remain elevated, she may need  to be started on an antihypertensive medication and to be  evaluated for preeclampsia.  Preeclampsia precautions were  discussed today. ----------------------------------------------------------------------                   Ma Rings, MD Electronically Signed Final Report   10/01/2020 04:44 pm ----------------------------------------------------------------------    Medications:  Scheduled  betamethasone acetate-betamethasone sodium phosphate  12 mg Intramuscular Q24H   docusate sodium  100 mg Oral Daily   prenatal multivitamin  1 tablet Oral Q1200   I have reviewed the patient's current medications.  ASSESSMENT: [id number] [redacted]w[redacted]d Estimated Date of Delivery: 12/14/20  Severe pre eclampsia, BP criteria on magnesium prophylaxis Periodic FHR declerations less now that contractions are less Suspect occult abruption given clinical presentation and history  PLAN: Continue magnesium Continue continuous monitoring Pt aware of need for stat C section if fetus has another fetal bradycardic episode or increase in decelerations  Lazaro Arms

## 2020-10-03 LAB — CBC
HCT: 26.7 % — ABNORMAL LOW (ref 36.0–46.0)
Hemoglobin: 8.6 g/dL — ABNORMAL LOW (ref 12.0–15.0)
MCH: 23.6 pg — ABNORMAL LOW (ref 26.0–34.0)
MCHC: 32.2 g/dL (ref 30.0–36.0)
MCV: 73.4 fL — ABNORMAL LOW (ref 80.0–100.0)
Platelets: 228 10*3/uL (ref 150–400)
RBC: 3.64 MIL/uL — ABNORMAL LOW (ref 3.87–5.11)
RDW: 14.6 % (ref 11.5–15.5)
WBC: 12.7 10*3/uL — ABNORMAL HIGH (ref 4.0–10.5)
nRBC: 0 % (ref 0.0–0.2)

## 2020-10-03 MED ORDER — SODIUM CHLORIDE 0.9 % IV SOLN
500.0000 mg | Freq: Once | INTRAVENOUS | Status: AC
  Administered 2020-10-03: 500 mg via INTRAVENOUS
  Filled 2020-10-03: qty 25

## 2020-10-03 MED ORDER — FERROUS SULFATE 325 (65 FE) MG PO TABS
325.0000 mg | ORAL_TABLET | ORAL | Status: DC
  Administered 2020-10-04 – 2020-10-05 (×2): 325 mg via ORAL
  Filled 2020-10-03 (×2): qty 1

## 2020-10-03 MED ORDER — VITAMIN C 250 MG PO TABS
250.0000 mg | ORAL_TABLET | ORAL | Status: DC
  Administered 2020-10-03 – 2020-10-05 (×2): 250 mg via ORAL
  Filled 2020-10-03 (×2): qty 1

## 2020-10-03 NOTE — Lactation Note (Signed)
This note was copied from a baby's chart. Lactation Consultation Note  Patient Name: Allison Boone [id number] Date: 10/03/2020 Reason for consult: Follow-up assessment;NICU baby;Preterm <34wks;Infant < 6lbs;Other (Comment) (history of 2 preterm stillbirths) Age:35 hours  LC in to visit with Mom while she was visiting baby in the NICU.  Mom states she has been pumping every 3 hrs, getting drops.  Encouraged Mom to continue with her consistent pumping as it can take 2-6 days for volume to increase.    Mom was just taken off MgSO4, so she was a bit tired.  Mom aware of lactation support available to her.   No questions currently. Lactation Tools Discussed/Used Tools: Pump;Flanges Breast pump type: Double-Electric Breast Pump Pumping frequency: Q 3 hrs Pumped volume: 0 mL (drops)  Interventions Interventions: Breast feeding basics reviewed;Assisted with latch;Breast massage;Hand express;DEBP;Education   Consult Status Consult Status: Follow-up Date: 10/04/20 Follow-up type: In-patient    [name] 10/03/2020, 1:34 PM

## 2020-10-03 NOTE — Lactation Note (Signed)
This note was copied from a baby's chart. Lactation Consultation Note  Patient Name: Allison Boone [id number] Date: 10/03/2020   Age:35 hours  LC attempts to visit with Mom, but she was sound asleep.  LC will f/u at a later time.   [name] 10/03/2020, 12:02 PM

## 2020-10-03 NOTE — Progress Notes (Signed)
Subjective: Postpartum Day 1: Cesarean Delivery Patient reports incisional pain.    Objective: Vital signs in last 24 hours: Temp:  [96.1 F (35.6 C)-97.9 F (36.6 C)] 97.9 F (36.6 C) (06/19 0357) Pulse Rate:  [64-84] 79 (06/19 0748) Resp:  [12-23] 14 (06/19 0748) BP: (104-125)/(66-88) 105/67 (06/19 0748) SpO2:  [92 %-98 %] 97 % (06/19 0748)  Physical Exam:  General: alert, cooperative, and no distress Lochia: appropriate Uterine Fundus: firm Incision: no significant drainage, no dehiscence, no significant erythema DVT Evaluation: No evidence of DVT seen on physical exam.  Recent Labs    10/01/20 2347 10/03/20 0437  HGB 10.7* 8.6*  HCT 33.9* 26.7*    Assessment/Plan: Status post Cesarean section. Doing well postoperatively.  Continue current care. Magnesium off at 1100 BP is excellent on the magnesium  Allison Boone 10/03/2020, 7:57 AM

## 2020-10-04 ENCOUNTER — Other Ambulatory Visit: Payer: Self-pay | Admitting: *Deleted

## 2020-10-04 DIAGNOSIS — Z8759 Personal history of other complications of pregnancy, childbirth and the puerperium: Secondary | ICD-10-CM

## 2020-10-04 NOTE — Progress Notes (Signed)
Postpartum Day 1: Cesarean Delivery Subjective: Patient reports tolerating PO, + flatus, + BM, and no problems voiding.  Ambulating.  Patient denies any headaches, visual symptoms, RUQ/epigastric pain or other concerning symptoms. Baby is doing well in NICU.  Objective: Vital signs in last 24 hours: Temp:  [97.6 F (36.4 C)-98.1 F (36.7 C)] 98.1 F (36.7 C) (06/20 0830) Pulse Rate:  [64-76] 64 (06/20 0830) Resp:  [15-20] 15 (06/20 0830) BP: (118-139)/(74-88) 125/78 (06/20 0830) SpO2:  [92 %-100 %] 92 % (06/20 0830)  Physical Exam:  General: alert, cooperative, and no distress Lochia: appropriate Uterine Fundus: firm Incision: no significant drainage, no dehiscence, no significant erythema DVT Evaluation: No evidence of DVT seen on physical exam.  Recent Labs    10/01/20 2347 10/03/20 0437  HGB 10.7* 8.6*  HCT 33.9* 26.7*   Assessment/Plan: Status post Cesarean section. Doing well postoperatively.  BP stable for now, no medications. Has received Venofer for anemia and BP control Continue current care.   Jaynie Collins, MD 10/04/2020, 11:45 AM

## 2020-10-04 NOTE — Plan of Care (Signed)
  Problem: Education: Goal: Knowledge of General Education information will improve Description: Including pain rating scale, medication(s)/side effects and non-pharmacologic comfort measures Outcome: Progressing   Problem: Health Behavior/Discharge Planning: Goal: Ability to manage health-related needs will improve Outcome: Progressing   Problem: Clinical Measurements: Goal: Will remain free from infection Outcome: Progressing Goal: Diagnostic test results will improve Outcome: Progressing Goal: Respiratory complications will improve Outcome: Progressing   Problem: Coping: Goal: Level of anxiety will decrease Outcome: Progressing   Problem: Elimination: Goal: Will not experience complications related to bowel motility Outcome: Progressing   Problem: Pain Managment: Goal: General experience of comfort will improve Outcome: Progressing   Problem: Safety: Goal: Ability to remain free from injury will improve Outcome: Progressing   Problem: Skin Integrity: Goal: Risk for impaired skin integrity will decrease Outcome: Progressing   Problem: Education: Goal: Knowledge of condition will improve Outcome: Progressing Goal: Individualized Educational Video(s) Outcome: Progressing Goal: Individualized Newborn Educational Video(s) Outcome: Progressing   Problem: Activity: Goal: Will verbalize the importance of balancing activity with adequate rest periods Outcome: Progressing Goal: Ability to tolerate increased activity will improve Outcome: Progressing   Problem: Coping: Goal: Ability to identify and utilize available resources and services will improve Outcome: Progressing   Problem: Life Cycle: Goal: Chance of risk for complications during the postpartum period will decrease Outcome: Progressing   Problem: Role Relationship: Goal: Ability to demonstrate positive interaction with newborn will improve Outcome: Progressing   Problem: Skin Integrity: Goal:  Demonstration of wound healing without infection will improve Outcome: Progressing

## 2020-10-04 NOTE — Lactation Note (Signed)
This note was copied from a baby's chart. Lactation Consultation Note  Patient Name: Allison Boone [id number] Date: 10/04/2020   Age:35 hours  LC has tried twice to see Mom.  First time today, she was asleep, and 2nd time she is out of her room.  Will check in NICU.   LC provided wash and drying bins to support Mom washing her pump parts.    Spoke to her RN about assisting Mom to pump to support a full milk supply.  LC to F/U 6/21   [name] 10/04/2020, 3:21 PM

## 2020-10-05 LAB — SURGICAL PATHOLOGY

## 2020-10-05 MED ORDER — IBUPROFEN 600 MG PO TABS: 600.0000 mg | ORAL_TABLET | Freq: Four times a day (QID) | ORAL | 0 refills | Status: DC

## 2020-10-05 MED ORDER — OXYCODONE HCL 5 MG PO TABS: 5.0000 mg | ORAL_TABLET | ORAL | 0 refills | Status: DC | PRN

## 2020-10-05 MED ORDER — FERROUS SULFATE 325 (65 FE) MG PO TABS: 325.0000 mg | ORAL_TABLET | ORAL | 3 refills | Status: DC

## 2020-10-05 NOTE — Clinical Social Work Maternal (Addendum)
CLINICAL SOCIAL WORK MATERNAL/CHILD NOTE  Patient Details  Name: Allison Boone MRN: [id number] Date of Birth: 11/09/1985  Date:  10-18-2020  Clinical Social Worker Initiating Note:  Glenard Haring Boyd-Gilyard Date/Time: Initiated:  10/05/20/1053     Child's Name:  Parent's have not named infant at the time of the assessment.   Biological Parents:  Mother, Father   Need for Interpreter:  None   Reason for Referral:  Parental Support of Premature Babies < 36 weeks/or Critically Ill babies, Behavioral Health Concerns   Address:  [place] [id number]    Phone number:  [phone] (home)     Additional phone number: FOB's number is [phone]  Household Members/Support Persons (HM/SP):   Household Member/Support Person 1, Household Member/Support Person 2   HM/SP Name Relationship DOB or Age  HM/SP -1 Dorris Fetch Bond son 03/02/2005  HM/SP -2 Ocean Schildt FOB 06/18/1985  HM/SP -3        HM/SP -4        HM/SP -5        HM/SP -6        HM/SP -7        HM/SP -8          Natural Supports (not living in the home):  Extended Family, Immediate Family, Spouse/significant other, Parent   Professional Supports: None   Employment: Unemployed   Type of Work:     Education:  Programmer, systems   Homebound arranged:    Museum/gallery curator Resources:  Medicaid   Other Resources:  Physicist, medical  , [place] Considerations Which May Impact Care:  None reported  Strengths:  Ability to meet basic needs  , Home prepared for child   (Peds list was provided)   Psychotropic Medications:         Pediatrician:       Pediatrician List:   [place]      Pediatrician Fax Number:    Risk Factors/Current Problems:  None   Cognitive State:  Alert  , Insightful  , Linear Thinking     Mood/Affect:  Interested  , Comfortable  , Flat  , Relaxed     CSW Assessment:  CSW met with MOB at infant's bedside to complete an assessment for anxiety and NICU admission.  When CSW arrived to infant's bedside in room 319, MOB was resting in the recliner observing infant in his isolette. MOB appeared comfortable. CSW explained CSW's role and MOB was receptive to CSW completing clinical assessment while MOB remained at infant's bedside.  MOB was soft spoken but was easy to engage.   CSW asked about MOB's thoughts and feelings about infant's NICU admission.  MOB reported feeling "Ok and hopeful." CSW validated MOB's feelings and discussed other emotions that MOB amy experience during the postpartum period.   CSW asked about MOB's hx of anxiety and MOB reported that wash was dx about 3 years about.  Per MOB, she was prescribed medication however she discontinued them around November 2021.  MOB reported that she has managed her symptoms naturally.   CSW provided education regarding the baby blues period vs. perinatal mood disorders, discussed treatment and gave resources for mental health follow up if concerns arise.  CSW recommends self-evaluation during the postpartum time period using the New Mom Checklist from Postpartum Progress and encouraged MOB  to contact a medical professional if symptoms are noted at any time. MOB presented with insight and awareness and did not demonstrate any acute MH symptoms. CSW assessed for safety and MOB denied SI, HI, and DV.   MOB denied barriers to visiting with infant and communicated having a good support team. Per MOB she has all essential items to car for infant and she expressed feeling prepared for infant's discharge.   CSW will continue to offer resources and supports to family while infant remains in NICU.    CSW Plan/Description:  Psychosocial Support and Ongoing Assessment of Needs, Perinatal Mood and Anxiety Disorder (PMADs) Education, Other Patient/Family Education, Other Information/Referral to [employer], MSW, [employer] Social Work [phone]   Dimple Nanas, LCSW 10/05/2020, 11:03 AM

## 2020-10-05 NOTE — Lactation Note (Signed)
This note was copied from a baby's chart. Lactation Consultation Note Maternal f/u on day 3 of infant's life. Mom pumping frequently and without difficulty. Milk volume is wnl. No s/s engorgement today.   Patient Name: Boy Sansa Alkema [id number] Date: 10/05/2020 Reason for consult: Follow-up assessment Age:35 hours   Feeding Mother's Current Feeding Choice: Breast Milk and Donor Milk    Lactation Tools Discussed/Used Pumping frequency: q3 Pumped volume: 120 mL Mother has ordered insurance pump  Consult Status Consult Status: Follow-up Follow-up type: In-patient   [name], MA IBCLC 10/05/2020, 11:02 AM

## 2020-10-06 ENCOUNTER — Encounter: Payer: Medicaid Other | Admitting: Obstetrics and Gynecology

## 2020-10-06 ENCOUNTER — Ambulatory Visit: Payer: Self-pay

## 2020-10-06 NOTE — Lactation Note (Signed)
This note was copied from a baby's chart. Lactation Consultation Note Mother has established normal milk supply with no s/s of engorgement.   Patient Name: Allison Boone Date: 10/06/2020 Reason for consult: Follow-up assessment Age:35 days  Feeding Mother's Current Feeding Choice: Breast Milk  Lactation Tools Discussed/Used Pumping frequency: q3 Pumped volume: 120 mL  Consult Status Consult Status: Follow-up Follow-up type: In-patient   [name], MA IBCLC 10/06/2020, 10:42 AM

## 2020-10-08 ENCOUNTER — Other Ambulatory Visit: Payer: Medicaid Other

## 2020-10-08 ENCOUNTER — Ambulatory Visit: Payer: Medicaid Other

## 2020-10-11 ENCOUNTER — Other Ambulatory Visit: Payer: Self-pay

## 2020-10-11 ENCOUNTER — Ambulatory Visit (INDEPENDENT_AMBULATORY_CARE_PROVIDER_SITE_OTHER): Payer: Medicaid Other

## 2020-10-11 VITALS — BP 136/85 | HR 76 | Ht 59.0 in | Wt 121.0 lb

## 2020-10-11 DIAGNOSIS — Z98891 History of uterine scar from previous surgery: Secondary | ICD-10-CM

## 2020-10-11 NOTE — Progress Notes (Signed)
Subjective:     Allison Boone is a 35 y.o. female who presents to the clinic 2 weeks status post  repear low transverse cesarean section  for  preterm delivery due to preeclampsia . Eating a regular diet without difficulty. Bowel movements are abnormal with some diarrhea . Pain is controlled with current analgesics. Medications being used: ibuprofen (OTC).  The following portions of the patient's history were reviewed and updated as appropriate: allergies, current medications, past family history, past medical history, past social history, past surgical history, and problem list.  Review of Systems Pertinent items are noted in HPI.    Objective:    BP 136/85 (BP Location: Right Arm, Patient Position: Sitting, Cuff Size: Normal)   Pulse 76   Ht 4\' 11"  (1.499 m)   Wt 121 lb (54.9 kg)   LMP 03/09/2020 (Approximate)   Breastfeeding Yes   BMI 24.44 kg/m  General:  alert, cooperative, and no distress  Abdomen: soft, bowel sounds active, non-tender  Incision:   healing well, no drainage, no erythema, no hernia, no seroma, no swelling, no dehiscence, incision well approximated     Assessment:    Doing well postoperatively.   Plan:    1. Continue any current medications. 2. Wound care discussed. 3. Activity restrictions:  per cesarean section protocol 4. Anticipated return to work:  unknown at this time . 5. Follow up: 4 weeks for postpartum exam.  6. Pt to start dulcolax suppositories and Gas X for bloating and prevent constipation.  03/11/2020, CMA

## 2020-10-12 ENCOUNTER — Other Ambulatory Visit: Payer: Medicaid Other

## 2020-10-15 ENCOUNTER — Ambulatory Visit: Payer: Self-pay

## 2020-10-15 NOTE — Lactation Note (Signed)
This note was copied from a baby's chart. Lactation Consultation Note Mother continues to pump with sufficient milk supply.   Patient Name: Allison Boone [id number] Date: 10/15/2020 Reason for consult: Follow-up assessment Age:36 days  Maternal Data  Mother denies breast pain or difficulty with pumping Pumping frequency: q3 with average Feeding Mother's Current Feeding Choice: Breast Milk  Interventions Interventions:  (weekly LC follow up) Pumping supplies   Consult Status Consult Status: Follow-up Follow-up type: In-patient   [name], MA IBCLC 10/15/2020, 1:27 PM

## 2020-10-16 ENCOUNTER — Inpatient Hospital Stay (HOSPITAL_COMMUNITY)
Admission: RE | Admit: 2020-10-16 | Discharge: 2020-10-16 | Disposition: A | Discharge status: Home / Self Care | Payer: Medicaid Other | Attending: Family Medicine | Admitting: Family Medicine

## 2020-10-16 DIAGNOSIS — R519 Headache, unspecified: Secondary | ICD-10-CM | POA: Diagnosis not present

## 2020-10-16 DIAGNOSIS — O165 Unspecified maternal hypertension, complicating the puerperium: Secondary | ICD-10-CM | POA: Insufficient documentation

## 2020-10-16 DIAGNOSIS — Z8249 Family history of ischemic heart disease and other diseases of the circulatory system: Secondary | ICD-10-CM | POA: Insufficient documentation

## 2020-10-16 DIAGNOSIS — O9089 Other complications of the puerperium, not elsewhere classified: Secondary | ICD-10-CM | POA: Diagnosis present

## 2020-10-16 DIAGNOSIS — O99893 Other specified diseases and conditions complicating puerperium: Secondary | ICD-10-CM | POA: Diagnosis not present

## 2020-10-16 LAB — PROTEIN / CREATININE RATIO, URINE
Creatinine, Urine: 77.44 mg/dL
Protein Creatinine Ratio: 0.4 mg/mg{Cre} — ABNORMAL HIGH (ref 0.00–0.15)
Total Protein, Urine: 31 mg/dL

## 2020-10-16 LAB — URINALYSIS, ROUTINE W REFLEX MICROSCOPIC
Bacteria, UA: NONE SEEN
Bilirubin Urine: NEGATIVE
Glucose, UA: NEGATIVE mg/dL
Ketones, ur: NEGATIVE mg/dL
Nitrite: NEGATIVE
Protein, ur: 30 mg/dL — AB
RBC / HPF: >50 RBC/hpf — ABNORMAL HIGH (ref 0–5)
Specific Gravity, Urine: 1.017 (ref 1.005–1.030)
pH: 8 (ref 5.0–8.0)

## 2020-10-16 LAB — COMPREHENSIVE METABOLIC PANEL
ALT: 18 U/L (ref 0–44)
AST: 18 U/L (ref 15–41)
Albumin: 3.2 g/dL — ABNORMAL LOW (ref 3.5–5.0)
Alkaline Phosphatase: 68 U/L (ref 38–126)
Anion gap: 7 (ref 5–15)
BUN: 14 mg/dL (ref 6–20)
CO2: 28 mmol/L (ref 22–32)
Calcium: 9.2 mg/dL (ref 8.9–10.3)
Chloride: 105 mmol/L (ref 98–111)
Creatinine, Ser: 0.91 mg/dL (ref 0.44–1.00)
GFR, Estimated: >60 mL/min (ref >60)
Glucose, Bld: 106 mg/dL — ABNORMAL HIGH (ref 70–99)
Potassium: 3.7 mmol/L (ref 3.5–5.1)
Sodium: 140 mmol/L (ref 135–145)
Total Bilirubin: 0.5 mg/dL (ref 0.3–1.2)
Total Protein: 6.9 g/dL (ref 6.5–8.1)

## 2020-10-16 LAB — CBC
HCT: 36.6 % (ref 36.0–46.0)
Hemoglobin: 11.4 g/dL — ABNORMAL LOW (ref 12.0–15.0)
MCH: 24.1 pg — ABNORMAL LOW (ref 26.0–34.0)
MCHC: 31.1 g/dL (ref 30.0–36.0)
MCV: 77.2 fL — ABNORMAL LOW (ref 80.0–100.0)
Platelets: 506 10*3/uL — ABNORMAL HIGH (ref 150–400)
RBC: 4.74 MIL/uL (ref 3.87–5.11)
RDW: 17.5 % — ABNORMAL HIGH (ref 11.5–15.5)
WBC: 7.8 10*3/uL (ref 4.0–10.5)
nRBC: 0 % (ref 0.0–0.2)

## 2020-10-16 MED ORDER — CYCLOBENZAPRINE HCL 5 MG PO TABS
10.0000 mg | ORAL_TABLET | Freq: Once | ORAL | Status: AC
  Administered 2020-10-16: 10 mg via ORAL
  Filled 2020-10-16: qty 2

## 2020-10-16 MED ORDER — LISINOPRIL 10 MG PO TABS: 10.0000 mg | ORAL_TABLET | Freq: Every day | ORAL | 1 refills | Status: DC

## 2020-10-16 NOTE — MAU Note (Addendum)
Headache since 1600. Denies any visual changes or epigastric pain. Had c/s on 10/02/20 with hx of preeclampsia. Having some VB which pt thinks may be her period. Bleeding had stopped following her C/S. Pt's baby is in NICU. Pt is hard of hearing

## 2020-10-16 NOTE — MAU Provider Note (Signed)
Chief Complaint:  Headache   None    HPI: Allison Boone is a 35 y.o. [phone] PP s/p c-section on 6/18 who presents to maternity admissions reporting headache. Patient reports a headache that started around 4-5pm this afternoon. Headache is mostly on the side and radiates into the back. She currently rates HA 10/10. She has been in the NICU all day visiting her baby. She has not tried anything to help relieve her HA. She denies vision changes, epigastric/RUQ pain, dizziness or SOB. She started having some light vaginal bleeding yesterday. Denies incisional pain or abdominal pain, urinary s/s, or fever.   Pregnancy Course:   Past Medical History:  Diagnosis Date   Anxiety    Phreesia 06/22/2020   Depression    Phreesia 06/22/2020   OB History  Gravida Para Term Preterm AB Living  4 4   4  0 2  SAB IAB Ectopic Multiple Live Births  0     0 2    # Outcome Date GA Lbr Len/2nd Weight Sex Delivery Anes PTL Lv  4 Preterm 10/02/20 [redacted]w[redacted]d  1220 g M CS-LTranv Gen  LIV  3 Preterm 2019 [redacted]w[redacted]d    Vag-Spont   FD  2 Preterm 2010 [redacted]w[redacted]d    Vag-Spont   FD  1 Preterm 03/02/05 [redacted]w[redacted]d  1559 g M CS-LTranv  Y LIV   Past Surgical History:  Procedure Laterality Date   CESAREAN SECTION N/A    Phreesia 06/22/2020   CESAREAN SECTION  10/02/2020   Procedure: CESAREAN SECTION;  Surgeon: 10/04/2020, MD;  Location: [facility] LD [facility]S;  Service: Obstetrics;;   Family History  Problem Relation Age of Onset   Cancer Neg Hx    Diabetes Neg Hx    Heart disease Neg Hx    Hypertension Neg Hx    Social History   Tobacco Use   Smoking status: Never   Smokeless tobacco: Never  Vaping Use   Vaping Use: Never used  Substance Use Topics   Alcohol use: Never   Drug use: Never   No Known Allergies No medications prior to admission.   I have reviewed patient's Past Medical Hx, Surgical Hx, Family Hx, Social Hx, medications and allergies.   ROS:  Review of Systems  Constitutional: Negative.   Respiratory: Negative.     Cardiovascular: Negative.   Gastrointestinal: Negative.   Genitourinary: Negative.   Musculoskeletal: Negative.   Neurological:  Positive for headaches.  Psychiatric/Behavioral: Negative.     Physical Exam  Patient Vitals for the past 24 hrs:  BP Temp Pulse Resp SpO2 Height Weight  10/16/20 0428 136/82 -- 63 -- -- -- --  10/16/20 0400 136/78 -- 61 -- -- -- --  10/16/20 0345 120/80 -- (!) 59 -- -- -- --  10/16/20 0330 135/76 -- (!) 57 -- -- -- --  10/16/20 0315 (!) 144/87 -- 61 -- -- -- --  10/16/20 0300 (!) 148/86 -- 62 -- -- -- --  10/16/20 0252 (!) 150/92 -- 63 -- -- -- --  10/16/20 0249 -- -- -- -- 99 % -- --  10/16/20 0244 (!) 144/89 -- 68 -- 98 % -- --  10/16/20 0029 (!) 147/88 -- 66 -- 99 % -- --  10/16/20 0022 -- 98.1 F (36.7 C) -- 16 -- 4\' 11"  (1.499 m) 55.3 kg   Constitutional: well-developed, well-nourished female in no acute distress.  Cardiovascular: normal rate Respiratory: normal effort GI: abd soft, non-tender MS: extremities nontender, +1 edema BLE, normal ROM Neurologic:  alert and oriented x 4,   Pelvic: deferred     Labs: Results for orders placed or performed during the hospital encounter of 10/16/20 (from the past 24 hour(s))  Urinalysis, Routine w reflex microscopic Urine, Clean Catch     Status: Abnormal   Collection Time: 10/16/20 12:36 AM  Result Value Ref Range   Color, Urine YELLOW YELLOW   APPearance CLEAR CLEAR   Specific Gravity, Urine 1.017 1.005 - 1.030   pH 8.0 5.0 - 8.0   Glucose, UA NEGATIVE NEGATIVE mg/dL   Hgb urine dipstick MODERATE (A) NEGATIVE   Bilirubin Urine NEGATIVE NEGATIVE   Ketones, ur NEGATIVE NEGATIVE mg/dL   Protein, ur 30 (A) NEGATIVE mg/dL   Nitrite NEGATIVE NEGATIVE   Leukocytes,Ua TRACE (A) NEGATIVE   RBC / HPF >50 (H) 0 - 5 RBC/hpf   WBC, UA 6-10 0 - 5 WBC/hpf   Bacteria, UA NONE SEEN NONE SEEN   Squamous Epithelial / LPF 0-5 0 - 5   Mucus PRESENT   Protein / creatinine ratio, urine     Status: Abnormal    Collection Time: 10/16/20 12:36 AM  Result Value Ref Range   Creatinine, Urine 77.44 mg/dL   Total Protein, Urine 31 mg/dL   Protein Creatinine Ratio 0.40 (H) 0.00 - 0.15 mg/mg[Cre]  CBC     Status: Abnormal   Collection Time: 10/16/20  1:07 AM  Result Value Ref Range   WBC 7.8 4.0 - 10.5 K/uL   RBC 4.74 3.87 - 5.11 MIL/uL   Hemoglobin 11.4 (L) 12.0 - 15.0 g/dL   HCT 84.6 65.9 - 93.5 %   MCV 77.2 (L) 80.0 - 100.0 fL   MCH 24.1 (L) 26.0 - 34.0 pg   MCHC 31.1 30.0 - 36.0 g/dL   RDW 70.1 (H) 77.9 - 39.0 %   Platelets 506 (H) 150 - 400 K/uL   nRBC 0.0 0.0 - 0.2 %  Comprehensive metabolic panel     Status: Abnormal   Collection Time: 10/16/20  1:07 AM  Result Value Ref Range   Sodium 140 135 - 145 mmol/L   Potassium 3.7 3.5 - 5.1 mmol/L   Chloride 105 98 - 111 mmol/L   CO2 28 22 - 32 mmol/L   Glucose, Bld 106 (H) 70 - 99 mg/dL   BUN 14 6 - 20 mg/dL   Creatinine, Ser 3.00 0.44 - 1.00 mg/dL   Calcium 9.2 8.9 - 92.3 mg/dL   Total Protein 6.9 6.5 - 8.1 g/dL   Albumin 3.2 (L) 3.5 - 5.0 g/dL   AST 18 15 - 41 U/L   ALT 18 0 - 44 U/L   Alkaline Phosphatase 68 38 - 126 U/L   Total Bilirubin 0.5 0.3 - 1.2 mg/dL   GFR, Estimated >30 >07 mL/min   Anion gap 7 5 - 15    Imaging:    MAU Course: Orders Placed This Encounter  Procedures   Urinalysis, Routine w reflex microscopic Urine, Clean Catch   CBC   Comprehensive metabolic panel   Protein / creatinine ratio, urine   Discharge patient   Meds ordered this encounter  Medications   cyclobenzaprine (FLEXERIL) tablet 10 mg   lisinopril (ZESTRIL) 10 MG tablet    Sig: Take 1 tablet (10 mg total) by mouth daily.    Dispense:  30 tablet    Refill:  1    Order Specific Question:   Supervising Provider    Answer:   Samara Snide  MDM: CBC, CMP, UPCR all reassuring BP's mild range on arrival however settled and normotensive. No severe range BP's  Flexeril PO ordered with improvement in headache. On reassessment, rates  headache 1/10 compared to 10/10 on arrival. Discussed with Dr. Shawnie Pons who recommends starting Lisinopril 10mg  daily. Patient otherwise stable for discharge home Discussed importance of self care with patient, especially given that her baby is in NICU. Recommend frequent rest breaks, ensuring that she eats regular meals and snacks/hydrates, as well as getting appropriate sleep.    Assessment: 1. Postpartum headache   2. Postpartum hypertension      Plan: Discharge home in stable condition  Strict return precautions reviewed with patient Will send message to [facility] to schedule patient for BP check in 1 week     Follow-up Information     [facility] Follow up.   Specialty: Obstetrics and Gynecology Why: 1 week for BP check. Someone will contact you with appointment. Contact information: [address], [address] [place] [phone]                Allergies as of 10/16/2020   No Known Allergies      Medication List     TAKE these medications    Blood Pressure Monitor Automat Devi 1 Device by Does not apply route daily. Automatic blood pressure cuff regular size. To monitor blood pressure regularly at home. ICD-10 code:Z34.90   ferrous sulfate 325 (65 FE) MG tablet Take 1 tablet (325 mg total) by mouth every other day.   ibuprofen 600 MG tablet Commonly known as: ADVIL Take 1 tablet (600 mg total) by mouth every 6 (six) hours.   lisinopril 10 MG tablet Commonly known as: ZESTRIL Take 1 tablet (10 mg total) by mouth daily.   multivitamin-prenatal 27-0.8 MG Tabs tablet Take 1 tablet by mouth daily at 12 noon.   oxyCODONE 5 MG immediate release tablet Commonly known as: Oxy IR/ROXICODONE Take 1-2 tablets (5-10 mg total) by mouth every 4 (four) hours as needed for moderate pain.        12/17/2020, MSN, CNM 10/16/2020 4:40 AM

## 2020-10-19 ENCOUNTER — Other Ambulatory Visit: Payer: Medicaid Other

## 2020-10-21 ENCOUNTER — Ambulatory Visit: Payer: Medicaid Other

## 2020-10-23 ENCOUNTER — Ambulatory Visit: Payer: Self-pay

## 2020-10-23 NOTE — Lactation Note (Signed)
This note was copied from a baby's chart. Lactation Consultation Note  Patient Name: Allison Boone [id number] Date: 10/23/2020 Reason for consult: Follow-up assessment;NICU baby;Preterm <34wks;Infant < 6lbs Age:35 wk.o.  Visited with mom of 32 4/7 (adjusted) NICU female, mom has been pumping consistently every 3 hours, and her supply has greatly increased it's above normal limits; but she's not experiencing any discomfort and continues to fully empty both breasts, praised her for her efforts.  Reviewed lactogenesis III, supply/ demand and benefits of STS care.  Plan of care:   Encouraged mom to continue pumping every 2-3 hours, ideally 8 pumping sessions/24 hours She'll call LC for assistance PRN   No support person at this time. Mom reported all questions and concerns were answered, she's aware of LC OP services and will call PRN.  Maternal Data    Feeding Mother's Current Feeding Choice: Breast Milk  LATCH Score                    Lactation Tools Discussed/Used Tools: Pump;Flanges Flange Size: 24 Breast pump type: Double-Electric Breast Pump Pump Education: Setup, frequency, and cleaning;Milk Storage Reason for Pumping: pre-term infant in NICU < 4 lbs Pumping frequency: q 3 hours Pumped volume: 240 mL (240-360 p/pumping session)  Interventions Interventions: Breast feeding basics reviewed;DEBP;Education  Discharge Pump: Personal;DEBP  Consult Status Consult Status: Follow-up Date: 10/29/20 Follow-up type: In-patient    Allison Boone 10/23/2020, 6:45 PM

## 2020-10-29 ENCOUNTER — Ambulatory Visit: Payer: Self-pay

## 2020-10-29 ENCOUNTER — Ambulatory Visit: Payer: Medicaid Other

## 2020-10-29 NOTE — Lactation Note (Signed)
This note was copied from a baby's chart. Lactation Consultation Note LC visited with mother to check pumping progress and to assist prn. Mother has abundant milk supply. She will likely need to pre-pump when IDF begins.   Patient Name: Allison Boone Date: 10/29/2020 Reason for consult: Follow-up assessment Age:35 wk.o.  Maternal Data  Pumping frequency: q3 with 300+ mL per pumping  Feeding Mother's Current Feeding Choice: Breast Milk   Consult Status Consult Status: Follow-up Follow-up type: In-patient   [name], MA IBCLC 10/29/2020, 1:14 PM

## 2020-11-01 ENCOUNTER — Ambulatory Visit (INDEPENDENT_AMBULATORY_CARE_PROVIDER_SITE_OTHER): Payer: Medicaid Other | Admitting: Obstetrics and Gynecology

## 2020-11-01 ENCOUNTER — Encounter: Payer: Self-pay | Admitting: Obstetrics and Gynecology

## 2020-11-01 ENCOUNTER — Other Ambulatory Visit: Payer: Self-pay

## 2020-11-01 MED ORDER — SLYND 4 MG PO TABS: 1.0000 | ORAL_TABLET | Freq: Every day | ORAL | 11 refills | Status: DC

## 2020-11-01 MED ORDER — DICLOXACILLIN SODIUM 500 MG PO CAPS: 500.0000 mg | ORAL_CAPSULE | Freq: Four times a day (QID) | ORAL | 0 refills | Status: AC

## 2020-11-01 NOTE — Progress Notes (Signed)
Post Partum Visit Note  Allison Boone is a 35 y.o. [phone] female who presents for a postpartum visit. She is 4 weeks postpartum following a repeat cesarean section.  I have fully reviewed the prenatal and intrapartum course. The delivery was at 29.4 gestational weeks.  Anesthesia: general. Postpartum course has been uncomplicated. Baby is doing well, in NICU. Baby is feeding by  Breast- pumping . Bleeding thin lochia. Bowel function is normal. Bladder function is normal. Patient is not sexually active. Contraception method is abstinence. Pt is interested in BTL. Postpartum depression screening: negative, score 2.   The pregnancy intention screening data noted above was reviewed. Potential methods of contraception were discussed. The patient elected to proceed with No data recorded.   Edinburgh Postnatal Depression Scale - 11/01/20 1547       Edinburgh Postnatal Depression Scale:  In the Past 7 Days   I have been able to laugh and see the funny side of things. 0    I have looked forward with enjoyment to things. 0    I have blamed myself unnecessarily when things went wrong. 0    I have been anxious or worried for no good reason. 0    I have felt scared or panicky for no good reason. 0    Things have been getting on top of me. 2    I have been so unhappy that I have had difficulty sleeping. 0    I have felt sad or miserable. 0    I have been so unhappy that I have been crying. 0    The thought of harming myself has occurred to me. 0    Edinburgh Postnatal Depression Scale Total 2             Health Maintenance Due  Topic Date Due   COVID-19 Vaccine (3 - Booster for Pfizer series) 01/29/2020       Review of Systems Pertinent items noted in HPI and remainder of comprehensive ROS otherwise negative.  Objective:  BP 93/67   Pulse (!) 112   Ht 4\' 11"  (1.499 m)   Wt 113 lb (51.3 kg)   BMI 22.82 kg/m    General:  alert, cooperative, and no distress   Breasts:  normal  right breast with left breast mastitis with erythema involving the outer half of the breast, warm to touch  Lungs: clear to auscultation bilaterally  Heart:  regular rate and rhythm  Abdomen: soft, non-tender; bowel sounds normal; no masses,  no organomegaly   Wound well approximated incision  GU exam:  not indicated       Assessment:    There are no diagnoses linked to this encounter.   postpartum exam.   Plan:   Essential components of care per ACOG recommendations:  1.  Mood and well being: Patient with negative depression screening today. Reviewed local resources for support.  - Patient tobacco use? No.   - hx of drug use? No.    2. Infant care and feeding:  -Patient currently breastmilk feeding? Yes. Reviewed importance of draining breast regularly to support lactation.  -Social determinants of health (SDOH) reviewed in EPIC. No concerns  3. Sexuality, contraception and birth spacing - Patient does not want a pregnancy in the next year.  Patient desires permanent sterilization- will be scheduled for [facility] salpingectomy  - Reviewed forms of contraception in tiered fashion. Patient desired oral progesterone-only contraceptive today.   - Discussed birth spacing of 18 months  4.  Sleep and fatigue -Encouraged family/partner/community support of 4 hrs of uninterrupted sleep to help with mood and fatigue  5. Physical Recovery  - Discussed patients delivery and complications. She describes her labor as good. - Patient had a C-section emergent.  - Patient has urinary incontinence? No. - Patient is safe to resume physical and sexual activity  6.  Health Maintenance - HM due items addressed Yes - Last pap smear  Diagnosis  Date Value Ref Range Status  07/14/2020   Final   - Negative for intraepithelial lesion or malignancy (NILM)   Pap smear not done at today's visit.  -Breast Cancer screening indicated? No.  Rx Dicloxacillin provided  7. Chronic Disease/Pregnancy Condition  follow up: None  - PCP follow up  Catalina Antigua, MD [facility], [facility]

## 2020-11-04 ENCOUNTER — Ambulatory Visit: Payer: Self-pay

## 2020-11-04 NOTE — Lactation Note (Signed)
This note was copied from a baby's chart. Lactation Consultation Note  Patient Name: [name] JJHER'D Date: 11/04/2020 Reason for consult: Follow-up assessment;NICU baby;Other (Comment) (mastitis) Age:35 wk.o.  Lactation followed up with Allison Boone. She was last seen on 7/15 by lactation and did not have any concerns. The evening of the 15th, she noted a small red spot on the upper quadrant of her left breast. She states it was a little sore.  Later that evening and through the weekend, she became febrile and had episodes of vomiting. She states that she saw her doctor and was diagnosed with mastitis in her left breast. She states that breast became red and inflamed.  She is currently taking antibiotics. We discussed causes of mastitis and could not pinpoint the origin of the illness. She does have oversupply however. I did a breast assessment, pumping assessment and bra assessment. We discussed treatment of mastitis including heat and cold therapies. I also asked her to check with her provider about lecithin as a treatment to prevent recurrent plugged ducts. She will try size 27 flanges with pumping to see if they are an improvement.  Allison Boone did not want to latch baby today due to concerns about mastitis, but she does want to breast feed.   Plan: Pump every three hours. Do not allow breasts to become engorged. Comfort pump, as needed, to reduce likelihood of engorgement. Try size 27 flanges next time. Warm moist compress prior to pumping several times a day. Ice compresses between sessions several times a day. Monitor breast tissue several times/day. Discuss lecithin protocol with provider to prevent plugged ducts.  Maternal Data Does the patient have breastfeeding experience prior to this delivery?: Yes How long did the patient breastfeed?: pumped several weeks; did not latch  Feeding Mother's Current Feeding Choice: Breast Milk   Lactation Tools Discussed/Used Tools:  Pump Flange Size: 24;Other (comment) (will try 27 next time) Breast pump type: Double-Electric Breast Pump Pump Education: Setup, frequency, and cleaning Reason for Pumping: NICU; infant separation Pumping frequency: rec 8 times a day; q3 hours Pumped volume: 150 mL (combined)  Interventions Interventions: Breast massage;DEBP;Ice;Education  Discharge Pump: DEBP  Consult Status Consult Status: Follow-up Follow-up type: In-patient    Walker Shadow 11/04/2020, 3:07 PM

## 2020-11-09 ENCOUNTER — Ambulatory Visit: Payer: Self-pay

## 2020-11-09 NOTE — Lactation Note (Signed)
This note was copied from a baby's chart. Lactation Consultation Note  Patient Name: Allison Boone [id number] Date: 11/09/2020 Reason for consult: Follow-up assessment;NICU baby Age:35 wk.o.  I followed up with [name]. She reports that her mastitis has resolved. She is still on her course of antibiotics; however, her milk volume has increased since last week. She is now pumping very strong volumes of 8-10 ounces/session. She states that her left breast has recovered.  [name] denies questions or concerns about pumping. I encouraged her to call me for assistance when she is ready to put Kyrie to the breast for lick and learn.  Feeding Mother's Current Feeding Choice: Breast Milk  Lactation Tools Discussed/Used Breast pump type: Double-Electric Breast Pump Pump Education: Setup, frequency, and cleaning Reason for Pumping: NICU; supplementation Pumping frequency: q3 Pumped volume: 240 mL (8-10 oz/session)  Interventions Interventions: Breast feeding basics reviewed;Education   Consult Status Consult Status: Follow-up Follow-up type: In-patient    Walker Shadow 11/09/2020, 11:35 AM

## 2020-11-10 ENCOUNTER — Encounter (HOSPITAL_BASED_OUTPATIENT_CLINIC_OR_DEPARTMENT_OTHER): Payer: Self-pay | Admitting: Obstetrics and Gynecology

## 2020-11-10 ENCOUNTER — Other Ambulatory Visit: Payer: Self-pay

## 2020-11-10 NOTE — Progress Notes (Signed)
Spoke w/ via phone for pre-op interview---Pt Lab needs dos----   type and screen, CBC, urine PCOT            Lab results------none COVID test -----patient states asymptomatic no test needed Arrive at -------0900 NPO after MN NO Solid Food.  Clear liquids from MN until---0800 Med rec completed Medications to take morning of surgery ----- Diabetic medication -----NA Patient instructed no nail polish to be worn day of surgery Patient instructed to bring photo id and insurance card day of surgery Patient aware to have Driver (ride ) Biomedical engineer    for 24 hours after surgery  Patient Special Instructions -----none Pre-Op special Istructions -----none Patient verbalized understanding of instructions that were given at this phone interview. Patient denies shortness of breath, chest pain, fever, cough at this phone interview.

## 2020-11-11 ENCOUNTER — Ambulatory Visit: Payer: Self-pay

## 2020-11-11 NOTE — Lactation Note (Signed)
This note was copied from a baby's chart. Lactation Consultation Note  Patient Name: Allison Boone Theall GEZMO'Q Date: 11/11/2020 Reason for consult: Follow-up assessment;NICU baby;Late-preterm 35-36.6wks Age:35 wk.o.  Visited with mom of 35 2/7 (adjusted) NICU female, SLP voiced that she's planning on doing both, bottles and BF once she gets home. However mom hasn't been taking baby to breast while at the hospital.  This LC came in at 2000 for baby's feeding but mom was already giving baby a bottle of breastmilk. Schedule a feeding assist appt for 1400 tomorrow. She also voiced that her mastitis has resolved and that she's still taking her antibiotics.   Plan of care  Encouraged mom to continue pumping consistently every 3 hours, at least 8 pumping sessions/24 hours She'll try to fully empty the breast after pumping, whether is with breast massage or/and hand expression to avoid another episode of plugged ducts or mastitis She'll call NICU LC for feeding assist PRN  No support person at this time. All questions and concerns answered.  Maternal Data   Mom's milk supply is ANL  Feeding Mother's Current Feeding Choice: Breast Milk Nipple Type: Nfant Extra Slow Flow (gold)  Lactation Tools Discussed/Used Tools: Pump Breast pump type: Double-Electric Breast Pump Pump Education: Setup, frequency, and cleaning;Milk Storage Reason for Pumping: LPI in NICU Pumping frequency: 8 times/24 hours Pumped volume: 240 mL (at least 8 oz/pumping session)  Interventions Interventions: Breast feeding basics reviewed;DEBP;Education  Discharge Pump: DEBP  Consult Status Consult Status: Follow-up Follow-up type: In-patient    Jasleen Riepe [name] 11/11/2020, 8:16 PM

## 2020-11-12 ENCOUNTER — Ambulatory Visit: Payer: Self-pay

## 2020-11-12 NOTE — Lactation Note (Signed)
This note was copied from a baby's chart. Lactation Consultation Note  Patient Name: Allison Boone Date: 11/12/2020 Reason for consult: Follow-up assessment;NICU baby;Preterm <34wks Age:35 wk.o.  1410 - I assisted Allison Boone latch baby Allison Boone to the left breast in football hold. He was in quiet alert state upon entry. He needed a little support to maintain latch via gentle breast compression initially, but then showed rhythmic suckling sequences interspersed with pauses for rest. He maintained his latch well. He fed approximately 10 minutes at the breast. I showed Allison Boone how to gently compress breast during prolonged pauses to encourage suckling. He responded well to this.  Allison Boone showed signs of relaxation at the breast. Allison Boone pre-pumped about 2 ounces out of this breast 30 minutes prior to latching him. She normally pumps 4 ounces/breast.  I encouraged her to continue with this plan to put baby on a pre-pumped breast at scheduled feeding times.  I followed up with Allison Boone, SLP, and we discussed Allison Boone's readiness for the IDF protocol.  Maternal Data Does the patient have breastfeeding experience prior to this delivery?: Yes  Feeding Mother's Current Feeding Choice: Breast Milk  LATCH Score Latch: Grasps breast easily, tongue down, lips flanged, rhythmical sucking.  Audible Swallowing: Spontaneous and intermittent  Type of Nipple: Everted at rest and after stimulation  Comfort (Breast/Nipple): Soft / non-tender  Hold (Positioning): Assistance needed to correctly position infant at breast and maintain latch.  LATCH Score: 9   Lactation Tools Discussed/Used - Pre pump and post pump (as needed)    Interventions Interventions: Breast feeding basics reviewed;Assisted with latch;Hand express;Breast compression;Adjust position;Support pillows;Education  Discharge Pump: DEBP  Consult Status Consult Status: Follow-up Follow-up type: In-patient    Walker Shadow 11/12/2020, 3:14 PM

## 2020-11-14 ENCOUNTER — Ambulatory Visit: Payer: Self-pay

## 2020-11-14 NOTE — Lactation Note (Signed)
This note was copied from a baby's chart. Lactation Consultation Note  Patient Name: Allison Boone QJJHE'R Date: 11/14/2020 Reason for consult: Follow-up assessment;Late-preterm 34-36.6wks;NICU baby Age:35 wk.o.  Mom woke up, but by the time LC came in the room, she was already giving baby a bottle of breastmilk. Asked mom if she would like for LC to come back later today or schedule an appt for next week and she chose to schedule an appt.   She has been taking baby to breast on her own, and continues pumping consistently; praised her for her efforts. Asked mom to call LC tonight if she still needed assistance with the feeding, let her know that she can call baby's RN and she'll page NICU LC.  Continue current plan of care  Maternal Data   Mom's supply is ANL  Feeding Mother's Current Feeding Choice: Breast Milk Nipple Type: Dr. Levert Feinstein Preemie  Lactation Tools Discussed/Used Tools: Pump;Flanges Flange Size: 24;27 Breast pump type: Double-Electric Breast Pump Pump Education: Setup, frequency, and cleaning;Milk Storage Reason for Pumping: LPI in NICU Pumping frequency: 8 times/24 hours Pumped volume: 240 mL  Interventions Interventions: Breast feeding basics reviewed;DEBP;Education  Discharge Pump: DEBP  Consult Status Consult Status: Follow-up Follow-up type: In-patient    Taiylor Virden [name] 11/14/2020, 2:17 PM

## 2020-11-14 NOTE — Lactation Note (Signed)
This note was copied from a baby's chart. Lactation Consultation Note  Patient Name: Allison Boone ITGPQ'D Date: 11/14/2020   Age:35 wk.o.  Attempted to visit with mom for 1400 feeding, but she was asleep. LC tried to talk to mom but she wouldn't wake up. Will F/U with her later.  Maternal Data    Feeding Nipple Type: Dr. Cline Crock   Lactation Tools Discussed/Used    Interventions    Discharge    Consult Status      Allison Boone 11/14/2020, 2:01 PM

## 2020-11-16 ENCOUNTER — Ambulatory Visit (HOSPITAL_BASED_OUTPATIENT_CLINIC_OR_DEPARTMENT_OTHER): Payer: Medicaid Other | Admitting: Anesthesiology

## 2020-11-16 ENCOUNTER — Encounter (HOSPITAL_BASED_OUTPATIENT_CLINIC_OR_DEPARTMENT_OTHER): Payer: Self-pay | Admitting: Obstetrics and Gynecology

## 2020-11-16 ENCOUNTER — Ambulatory Visit (HOSPITAL_BASED_OUTPATIENT_CLINIC_OR_DEPARTMENT_OTHER)
Admission: RE | Admit: 2020-11-16 | Discharge: 2020-11-16 | Disposition: A | Discharge status: Home / Self Care | Payer: Medicaid Other | Attending: Obstetrics and Gynecology | Admitting: Obstetrics and Gynecology

## 2020-11-16 ENCOUNTER — Encounter (HOSPITAL_BASED_OUTPATIENT_CLINIC_OR_DEPARTMENT_OTHER)
Admission: RE | Disposition: A | Discharge status: Home / Self Care | Payer: Self-pay | Source: Home / Self Care | Attending: Obstetrics and Gynecology

## 2020-11-16 ENCOUNTER — Other Ambulatory Visit: Payer: Self-pay

## 2020-11-16 DIAGNOSIS — Z302 Encounter for sterilization: Secondary | ICD-10-CM

## 2020-11-16 HISTORY — PX: LAPAROSCOPIC BILATERAL SALPINGECTOMY: SHX5889

## 2020-11-16 LAB — TYPE AND SCREEN
ABO/RH(D): O POS
Antibody Screen: NEGATIVE

## 2020-11-16 LAB — CBC
HCT: 37.8 % (ref 36.0–46.0)
Hemoglobin: 11.5 g/dL — ABNORMAL LOW (ref 12.0–15.0)
MCH: 23.3 pg — ABNORMAL LOW (ref 26.0–34.0)
MCHC: 30.4 g/dL (ref 30.0–36.0)
MCV: 76.7 fL — ABNORMAL LOW (ref 80.0–100.0)
Platelets: 586 10*3/uL — ABNORMAL HIGH (ref 150–400)
RBC: 4.93 MIL/uL (ref 3.87–5.11)
RDW: 18.6 % — ABNORMAL HIGH (ref 11.5–15.5)
WBC: 3.8 10*3/uL — ABNORMAL LOW (ref 4.0–10.5)
nRBC: 0 % (ref 0.0–0.2)

## 2020-11-16 LAB — POCT PREGNANCY, URINE: Preg Test, Ur: NEGATIVE

## 2020-11-16 SURGERY — SALPINGECTOMY, BILATERAL, LAPAROSCOPIC
Anesthesia: General | Site: Abdomen | Laterality: Bilateral

## 2020-11-16 MED ORDER — DEXAMETHASONE SODIUM PHOSPHATE 4 MG/ML IJ SOLN
INTRAMUSCULAR | Status: DC | PRN
  Administered 2020-11-16: 8 mg via INTRAVENOUS

## 2020-11-16 MED ORDER — AMISULPRIDE (ANTIEMETIC) 5 MG/2ML IV SOLN: 10.0000 mg | Freq: Once | INTRAVENOUS | Status: DC | PRN

## 2020-11-16 MED ORDER — PROPOFOL 10 MG/ML IV BOLUS
INTRAVENOUS | Status: AC
  Filled 2020-11-16: qty 20

## 2020-11-16 MED ORDER — POVIDONE-IODINE 10 % EX SWAB: 2.0000 "application " | Freq: Once | CUTANEOUS | Status: DC

## 2020-11-16 MED ORDER — FENTANYL CITRATE (PF) 100 MCG/2ML IJ SOLN
INTRAMUSCULAR | Status: DC | PRN
  Administered 2020-11-16 (×2): 25 ug via INTRAVENOUS
  Administered 2020-11-16: 50 ug via INTRAVENOUS

## 2020-11-16 MED ORDER — LIDOCAINE HCL (PF) 2 % IJ SOLN
INTRAMUSCULAR | Status: AC
  Filled 2020-11-16: qty 5

## 2020-11-16 MED ORDER — SCOPOLAMINE 1 MG/3DAYS TD PT72
1.0000 | MEDICATED_PATCH | TRANSDERMAL | Status: DC
  Administered 2020-11-16: 1.5 mg via TRANSDERMAL

## 2020-11-16 MED ORDER — DEXAMETHASONE SODIUM PHOSPHATE 10 MG/ML IJ SOLN
INTRAMUSCULAR | Status: AC
  Filled 2020-11-16: qty 1

## 2020-11-16 MED ORDER — PROMETHAZINE HCL 25 MG/ML IJ SOLN: 6.2500 mg | INTRAMUSCULAR | Status: DC | PRN

## 2020-11-16 MED ORDER — BUPIVACAINE HCL (PF) 0.25 % IJ SOLN
INTRAMUSCULAR | Status: DC | PRN
  Administered 2020-11-16: 30 mL

## 2020-11-16 MED ORDER — SCOPOLAMINE 1 MG/3DAYS TD PT72
MEDICATED_PATCH | TRANSDERMAL | Status: AC
  Filled 2020-11-16: qty 1

## 2020-11-16 MED ORDER — FENTANYL CITRATE (PF) 100 MCG/2ML IJ SOLN
INTRAMUSCULAR | Status: AC
  Filled 2020-11-16: qty 2

## 2020-11-16 MED ORDER — ACETAMINOPHEN 500 MG PO TABS
ORAL_TABLET | ORAL | Status: AC
  Filled 2020-11-16: qty 2

## 2020-11-16 MED ORDER — SODIUM CHLORIDE 0.9 % IR SOLN
Status: DC | PRN
  Administered 2020-11-16: 1000 mL

## 2020-11-16 MED ORDER — ONDANSETRON HCL 4 MG/2ML IJ SOLN
INTRAMUSCULAR | Status: DC | PRN
Start: 2020-11-16 — End: 2020-11-16
  Administered 2020-11-16: 4 mg via INTRAVENOUS

## 2020-11-16 MED ORDER — OXYCODONE HCL 5 MG/5ML PO SOLN: 5.0000 mg | Freq: Once | ORAL | Status: DC | PRN

## 2020-11-16 MED ORDER — SUGAMMADEX SODIUM 200 MG/2ML IV SOLN
INTRAVENOUS | Status: DC | PRN
  Administered 2020-11-16: 200 mg via INTRAVENOUS

## 2020-11-16 MED ORDER — OXYCODONE-ACETAMINOPHEN 5-325 MG PO TABS: 1.0000 | ORAL_TABLET | ORAL | 0 refills | Status: AC | PRN

## 2020-11-16 MED ORDER — FENTANYL CITRATE (PF) 100 MCG/2ML IJ SOLN
25.0000 ug | INTRAMUSCULAR | Status: DC | PRN
  Administered 2020-11-16: 25 ug via INTRAVENOUS

## 2020-11-16 MED ORDER — ROCURONIUM BROMIDE 100 MG/10ML IV SOLN
INTRAVENOUS | Status: DC | PRN
  Administered 2020-11-16: 50 mg via INTRAVENOUS

## 2020-11-16 MED ORDER — PROPOFOL 10 MG/ML IV BOLUS
INTRAVENOUS | Status: DC | PRN
  Administered 2020-11-16: 120 mg via INTRAVENOUS

## 2020-11-16 MED ORDER — OXYCODONE HCL 5 MG PO TABS: 5.0000 mg | ORAL_TABLET | Freq: Once | ORAL | Status: DC | PRN

## 2020-11-16 MED ORDER — ACETAMINOPHEN 500 MG PO TABS
1000.0000 mg | ORAL_TABLET | Freq: Once | ORAL | Status: AC
  Administered 2020-11-16: 1000 mg via ORAL

## 2020-11-16 MED ORDER — MIDAZOLAM HCL 2 MG/2ML IJ SOLN
INTRAMUSCULAR | Status: AC
  Filled 2020-11-16: qty 2

## 2020-11-16 MED ORDER — LIDOCAINE HCL (CARDIAC) PF 100 MG/5ML IV SOSY
PREFILLED_SYRINGE | INTRAVENOUS | Status: DC | PRN
  Administered 2020-11-16: 60 mg via INTRAVENOUS

## 2020-11-16 MED ORDER — BUPIVACAINE HCL (PF) 0.25 % IJ SOLN
INTRAMUSCULAR | Status: AC
  Filled 2020-11-16: qty 30

## 2020-11-16 MED ORDER — MIDAZOLAM HCL 5 MG/5ML IJ SOLN
INTRAMUSCULAR | Status: DC | PRN
  Administered 2020-11-16 (×2): 1 mg via INTRAVENOUS

## 2020-11-16 MED ORDER — LACTATED RINGERS IV SOLN: INTRAVENOUS | Status: DC

## 2020-11-16 MED ORDER — ONDANSETRON HCL 4 MG/2ML IJ SOLN
INTRAMUSCULAR | Status: AC
  Filled 2020-11-16: qty 2

## 2020-11-16 MED ORDER — IBUPROFEN 600 MG PO TABS: 600.0000 mg | ORAL_TABLET | Freq: Four times a day (QID) | ORAL | 3 refills | Status: AC | PRN

## 2020-11-16 SURGICAL SUPPLY — 33 items
ADH SKN CLS APL DERMABOND .7 (GAUZE/BANDAGES/DRESSINGS) ×1
APL SRG 38 LTWT LNG FL B (MISCELLANEOUS)
APPLICATOR ARISTA FLEXITIP XL (MISCELLANEOUS) IMPLANT
BAG SPEC RTRVL LRG 6X4 10 (ENDOMECHANICALS)
DERMABOND ADVANCED (GAUZE/BANDAGES/DRESSINGS) ×1
DERMABOND ADVANCED .7 DNX12 (GAUZE/BANDAGES/DRESSINGS) ×1 IMPLANT
DRSG OPSITE POSTOP 3X4 (GAUZE/BANDAGES/DRESSINGS) IMPLANT
DURAPREP 26ML APPLICATOR (WOUND CARE) ×2 IMPLANT
ELECT REM PT RETURN 9FT ADLT (ELECTROSURGICAL)
ELECTRODE REM PT RTRN 9FT ADLT (ELECTROSURGICAL) IMPLANT
GAUZE 4X4 16PLY ~~LOC~~+RFID DBL (SPONGE) ×4 IMPLANT
GLOVE SURG POLYISO LF SZ6 (GLOVE) ×2 IMPLANT
GLOVE SURG POLYISO LF SZ6.5 (GLOVE) ×2 IMPLANT
GLOVE SURG UNDER POLY LF SZ6.5 (GLOVE) ×4 IMPLANT
GOWN STRL REUS W/TWL LRG LVL3 (GOWN DISPOSABLE) ×4 IMPLANT
HEMOSTAT ARISTA ABSORB 3G PWDR (HEMOSTASIS) IMPLANT
KIT TURNOVER CYSTO (KITS) ×2 IMPLANT
NS IRRIG 1000ML POUR BTL (IV SOLUTION) IMPLANT
PACK LAPAROSCOPY BASIN (CUSTOM PROCEDURE TRAY) ×2 IMPLANT
PACK TRENDGUARD 450 HYBRID PRO (MISCELLANEOUS) ×1 IMPLANT
POUCH SPECIMEN RETRIEVAL 10MM (ENDOMECHANICALS) IMPLANT
PROTECTOR NERVE ULNAR (MISCELLANEOUS) ×4 IMPLANT
SET SUCTION IRRIG HYDROSURG (IRRIGATION / IRRIGATOR) IMPLANT
SET TRI-LUMEN FLTR TB AIRSEAL (TUBING) ×2 IMPLANT
SHEARS HARMONIC ACE PLUS 36CM (ENDOMECHANICALS) ×2 IMPLANT
SUT MNCRL AB 4-0 PS2 18 (SUTURE) ×2 IMPLANT
SUT VICRYL 0 UR6 27IN ABS (SUTURE) ×4 IMPLANT
TOWEL OR 17X26 10 PK STRL BLUE (TOWEL DISPOSABLE) ×2 IMPLANT
TRAY FOLEY W/BAG SLVR 14FR LF (SET/KITS/TRAYS/PACK) ×2 IMPLANT
TRENDGUARD 450 HYBRID PRO PACK (MISCELLANEOUS) ×2
TROCAR BALLN 12MMX100 BLUNT (TROCAR) ×2 IMPLANT
TROCAR BLADELESS OPT 5 100 (ENDOMECHANICALS) ×6 IMPLANT
WARMER LAPAROSCOPE (MISCELLANEOUS) ×2 IMPLANT

## 2020-11-16 NOTE — Anesthesia Preprocedure Evaluation (Addendum)
Anesthesia Evaluation  Patient identified by MRN, date of birth, ID band Patient awake    Reviewed: Allergy & Precautions, NPO status , Patient's Chart, lab work & pertinent test results  Airway Mallampati: II  TM Distance: >3 FB Neck ROM: Full    Dental no notable dental hx.    Pulmonary neg pulmonary ROS,    Pulmonary exam normal breath sounds clear to auscultation       Cardiovascular negative cardio ROS Normal cardiovascular exam Rhythm:Regular Rate:Normal     Neuro/Psych PSYCHIATRIC DISORDERS Anxiety Depression negative neurological ROS     GI/Hepatic negative GI ROS, Neg liver ROS,   Endo/Other  negative endocrine ROS  Renal/GU negative Renal ROS  negative genitourinary   Musculoskeletal negative musculoskeletal ROS (+)   Abdominal   Peds negative pediatric ROS (+)  Hematology negative hematology ROS (+)   Anesthesia Other Findings   Reproductive/Obstetrics negative OB ROS                             Anesthesia Physical Anesthesia Plan  ASA: 2  Anesthesia Plan: General   Post-op Pain Management:    Induction: Intravenous  PONV Risk Score and Plan: Ondansetron, Midazolam, Treatment may vary due to age or medical condition, Scopolamine patch - Pre-op and Dexamethasone  Airway Management Planned: Oral ETT  Additional Equipment: None  Intra-op Plan:   Post-operative Plan: Extubation in OR  Informed Consent: I have reviewed the patients History and Physical, chart, labs and discussed the procedure including the risks, benefits and alternatives for the proposed anesthesia with the patient or authorized representative who has indicated his/her understanding and acceptance.     Dental advisory given  Plan Discussed with: CRNA, Anesthesiologist and Surgeon  Anesthesia Plan Comments:         Anesthesia Quick Evaluation

## 2020-11-16 NOTE — Transfer of Care (Signed)
Immediate Anesthesia Transfer of Care Note  Patient: Allison Boone  Procedure(s) Performed: LAPAROSCOPIC BILATERAL SALPINGECTOMY (Bilateral: Abdomen)  Patient Location: PACU  Anesthesia Type:General  Level of Consciousness: awake, alert  and oriented  Airway & Oxygen Therapy: Patient Spontanous Breathing and Patient connected to face mask oxygen  Post-op Assessment: Report given to RN and Post -op Vital signs reviewed and stable  Post vital signs: Reviewed and stable  Last Vitals:  Vitals Value Taken Time  BP 111/70 11/16/20 1240  Temp 36.3 C 11/16/20 1240  Pulse 81 11/16/20 1243  Resp 12 11/16/20 1243  SpO2 99 % 11/16/20 1243  Vitals shown include unvalidated device data.  Last Pain:  Vitals:   11/16/20 1024  TempSrc: Oral  PainSc: 0-No pain      Patients Stated Pain Goal: 3 (11/16/20 1024)  Complications: No notable events documented.

## 2020-11-16 NOTE — Discharge Instructions (Signed)
DISCHARGE INSTRUCTIONS: Laparoscopy ° °The following instructions have been prepared to help you care for yourself upon your return home today. ° °Wound care: °• Do not get the incision wet for the first 24 hours. The incision should be kept clean and dry. °• The Band-Aids or dressings may be removed the day after surgery. °• Should the incision become sore, red, and swollen after the first week, check with your doctor. ° °Personal hygiene: °• Shower the day after your procedure. ° °Activity and limitations: °• Do NOT drive or operate any equipment today. °• Do NOT lift anything more than 15 pounds for 2-3 weeks after surgery. °• Do NOT rest in bed all day. °• Walking is encouraged. Walk each day, starting slowly with 5-minute walks 3 or 4 times a day. Slowly increase the length of your walks. °• Walk up and down stairs slowly. °• Do NOT do strenuous activities, such as golfing, playing tennis, bowling, running, biking, weight lifting, gardening, mowing, or vacuuming for 2-4 weeks. Ask your doctor when it is okay to start. ° °Diet: Eat a light meal as desired this evening. You may resume your usual diet tomorrow. ° °Return to work: This is dependent on the type of work you do. For the most part you can return to a desk job within a week of surgery. If you are more active at work, please discuss this with your doctor. ° °What to expect after your surgery: You may have a slight burning sensation when you urinate on the first day. You may have a very small amount of blood in the urine. Expect to have a small amount of vaginal discharge/light bleeding for 1-2 weeks. It is not unusual to have abdominal soreness and bruising for up to 2 weeks. You may be tired and need more rest for about 1 week. You may experience shoulder pain for 24-72 hours. Lying flat in bed may relieve it. ° °Call your doctor for any of the following: °• Develop a fever of 100.4 or greater °• Inability to urinate 6 hours after discharge from  hospital °• Severe pain not relieved by pain medications °• Persistent of heavy bleeding at incision site °• Redness or swelling around incision site after a week °• Increasing nausea or vomiting ° ° °Post Anesthesia Home Care Instructions ° °Activity: °Get plenty of rest for the remainder of the day. A responsible individual must stay with you for 24 hours following the procedure.  °For the next 24 hours, DO NOT: °-Drive a car °-Operate machinery °-Drink alcoholic beverages °-Take any medication unless instructed by your physician °-Make any legal decisions or sign important papers. ° °Meals: °Start with liquid foods such as gelatin or soup. Progress to regular foods as tolerated. Avoid greasy, spicy, heavy foods. If nausea and/or vomiting occur, drink only clear liquids until the nausea and/or vomiting subsides. Call your physician if vomiting continues. ° °Special Instructions/Symptoms: °Your throat may feel dry or sore from the anesthesia or the breathing tube placed in your throat during surgery. If this causes discomfort, gargle with warm salt water. The discomfort should disappear within 24 hours. ° °If you had a scopolamine patch placed behind your ear for the management of post- operative nausea and/or vomiting: ° °1. The medication in the patch is effective for 72 hours, after which it should be removed.  Wrap patch in a tissue and discard in the trash. Wash hands thoroughly with soap and water. °2. You may remove the patch earlier than 72   hours if you experience unpleasant side effects which may include dry mouth, dizziness or visual disturbances. 3. Avoid touching the patch. Wash your hands with soap and water after contact with the patch.    No acetaminophen/Tylenol until after 4:00 pm today if needed.

## 2020-11-16 NOTE — Anesthesia Procedure Notes (Deleted)
Procedure Name: Intubation Date/Time: 11/16/2020 11:50 AM Performed by: Karen Kitchens, CRNA Pre-anesthesia Checklist: Patient identified, Emergency Drugs available, Suction available and Patient being monitored Patient Re-evaluated:Patient Re-evaluated prior to induction Oxygen Delivery Method: Circle system utilized Preoxygenation: Pre-oxygenation with 100% oxygen Induction Type: IV induction Ventilation: Mask ventilation without difficulty Tube type: Oral Number of attempts: 1 Airway Equipment and Method: Stylet and Oral airway Placement Confirmation: ETT inserted through vocal cords under direct vision, positive ETCO2 and breath sounds checked- equal and bilateral Tube secured with: Tape Dental Injury: Teeth and Oropharynx as per pre-operative assessment

## 2020-11-16 NOTE — H&P (Signed)
Allison Boone is an 35 y.o. female P2 with BMI 23 presenting today for scheduled permanent sterilization. Patient is doing well without complaints. Patient is breastfeeding and has not had a period yet. She was prescribed progesterone only pill for contraception and opted not to take them. She has remained abstinent since her postpartum visit  Pertinent Gynecological History: Menses: regular every month without intermenstrual spotting Contraception: abstinence DES exposure: denies Blood transfusions: none Sexually transmitted diseases: no past history Last pap: normal Date: 06/2020 OB History: G2, P0202   Menstrual History: Patient's last menstrual period was 03/07/2020 (approximate).    Past Medical History:  Diagnosis Date   Anxiety    [employer] 06/22/2020   Depression    [employer] 06/22/2020    Past Surgical History:  Procedure Laterality Date   CESAREAN SECTION N/A 2006   [employer] 06/22/2020   CESAREAN SECTION  10/02/2020   Procedure: CESAREAN SECTION;  Surgeon: Reva Bores, MD;  Location: [facility] LD ORS;  Service: Obstetrics;;    Family History  Problem Relation Age of Onset   Cancer Neg Hx    Diabetes Neg Hx    Heart disease Neg Hx    Hypertension Neg Hx     Social History:  reports that she has never smoked. She has never used smokeless tobacco. She reports that she does not drink alcohol and does not use drugs.  Allergies: No Known Allergies  Medications Prior to Admission  Medication Sig Dispense Refill Last Dose   dicloxacillin (DYNAPEN) 500 MG capsule Take 1 capsule (500 mg total) by mouth 4 (four) times daily. 40 capsule 0    Prenatal Vit-Fe Fumarate-FA (MULTIVITAMIN-PRENATAL) 27-0.8 MG TABS tablet Take 1 tablet by mouth daily at 12 noon.       Review of Systems See pertinent in HPI. All other systems reviewed and non contributory Height 4\' 11"  (1.499 m), weight 52.2 kg, last menstrual period 03/07/2020, currently breastfeeding. Physical Exam GENERAL:  Well-developed, well-nourished female in no acute distress.  LUNGS: Clear to auscultation bilaterally.  HEART: Regular rate and rhythm. ABDOMEN: Soft, nontender, nondistended. No organomegaly. PELVIC: Deferred to OR EXTREMITIES: No cyanosis, clubbing, or edema, 2+ distal pulses.  Results for orders placed or performed during the hospital encounter of 11/16/20 (from the past 24 hour(s))  Pregnancy, urine POC     Status: None   Collection Time: 11/16/20  9:33 AM  Result Value Ref Range   Preg Test, Ur NEGATIVE NEGATIVE    No results found.  Assessment/Plan: 35 yo P2 here for permanent sterilization Other reversible forms of contraception were discussed with patient; she declines all other modalities.  Risks of procedure discussed with patient including permanence of method, risk of regret, bleeding, infection, injury to surrounding organs and need for additional procedures including laparotomy.  Failure risk less than 0.5% with increased risk of ectopic gestation if pregnancy occurs was also discussed with patient.  Written informed consent was obtained.      Dymir Neeson 11/16/2020, 9:47 AM

## 2020-11-16 NOTE — Anesthesia Postprocedure Evaluation (Signed)
Anesthesia Post Note  Patient: Allison Boone  Procedure(s) Performed: LAPAROSCOPIC BILATERAL SALPINGECTOMY (Bilateral: Abdomen)     Patient location during evaluation: PACU Anesthesia Type: General Level of consciousness: awake Pain management: pain level controlled Vital Signs Assessment: post-procedure vital signs reviewed and stable Respiratory status: spontaneous breathing and respiratory function stable Cardiovascular status: stable Postop Assessment: no apparent nausea or vomiting Anesthetic complications: no   No notable events documented.  Last Vitals:  Vitals:   11/16/20 1245 11/16/20 1300  BP: 105/69 106/65  Pulse: 85 70  Resp: 17 15  Temp:    SpO2: 97% 98%    Last Pain:  Vitals:   11/16/20 1304  TempSrc:   PainSc: 4                  Candra R Jaleen Grupp

## 2020-11-16 NOTE — Op Note (Signed)
Allison Boone PROCEDURE DATE: 11/16/2020   PREOPERATIVE DIAGNOSIS:  Undesired fertility  POSTOPERATIVE DIAGNOSIS:  Undesired fertility  PROCEDURE:  Laparoscopic Bilateral Salpingectomy   SURGEON: Catalina Antigua, MD  ASSISTANT: Willodean Rosenthal, MD  ANESTHESIA:  General endotracheal  COMPLICATIONS:  None immediate.  ESTIMATED BLOOD LOSS:  20 ml.  FLUIDS:400 ml LR.  URINE OUTPUT:  50 ml of clear urine.  IINDICATIONS: 35 y.o. [id number] with undesired fertility, desires permanent sterilization. Other reversible forms of contraception were discussed with patient; she declines all other modalities.  Risks of procedure discussed with patient including permanence of method, risk of regret, bleeding, infection, injury to surrounding organs and need for additional procedures including laparotomy.  Failure risk less than 0.5% with increased risk of ectopic gestation if pregnancy occurs was also discussed with patient.  Written informed consent was obtained.    FINDINGS:  Normal uterus, fallopian tubes, and ovaries.  TECHNIQUE:  The patient was taken to the operating room where general anesthesia was obtained without difficulty.  She was then placed in the dorsal lithotomy position and prepared and draped in sterile fashion.  After an adequate timeout was performed, a bivalved speculum was then placed in the patient's vagina, and the anterior lip of cervix grasped with the single-tooth tenaculum.  The uterine manipulator was then advanced into the uterus.  The speculum was removed from the vagina.  Attention was then turned to the patient's abdomen where a 5-mm skin incision was made in the umbilical fold.  Using a 5-mm scope with opti-view trocar, the 5-mm trocar and sleeve were then advanced without difficulty into the abdomen. Intraabdominal placement was confirmed with the laparoscope. The abdomen was then insufflated with carbon dioxide gas.  Adequate pneumoperitoneum was obtained.  A  survey of the patient's pelvis and abdomen revealed the findings above.  Bilateral 5-mm lower quadrant ports  were then placed under direct visualization.  The fallopian tubes were transected from the uterine attachments and the underlying mesosalpinx with the Harmonic device allowing for bilateral salpingectomy.  The fallopian tubes were then removed from the abdomen under direct visualization.  The operative site was surveyed, and it was found to be hemostatic.   No intraoperative injury to other surrounding organs was noted.  The abdomen was desufflated and all instruments were then removed from the patient's abdomen. All skin incisions were closed with Dermabond.  The uterine manipulator was removed from the vagina without complications. The patient tolerated the procedure well.  Sponge, lap, and needle counts were correct times two.  The patient was then taken to the recovery room awake, extubated and in stable condition.  The patient will be discharged to home as per PACU criteria.  Routine postoperative instructions given.

## 2020-11-16 NOTE — Anesthesia Procedure Notes (Addendum)
Procedure Name: Intubation Date/Time: 11/16/2020 11:50 AM Performed by: Mechele Claude, CRNA Pre-anesthesia Checklist: Patient identified, Emergency Drugs available, Suction available and Patient being monitored Patient Re-evaluated:Patient Re-evaluated prior to induction Oxygen Delivery Method: Circle system utilized Preoxygenation: Pre-oxygenation with 100% oxygen Induction Type: IV induction Ventilation: Mask ventilation without difficulty Laryngoscope Size: Mac and 3 Grade View: Grade I Tube type: Oral Tube size: 7.0 mm Number of attempts: 1 Airway Equipment and Method: Stylet Placement Confirmation: ETT inserted through vocal cords under direct vision, positive ETCO2 and breath sounds checked- equal and bilateral Secured at: 20 cm Tube secured with: Tape Dental Injury: Teeth and Oropharynx as per pre-operative assessment

## 2020-11-17 ENCOUNTER — Ambulatory Visit: Payer: Self-pay

## 2020-11-17 ENCOUNTER — Encounter (HOSPITAL_BASED_OUTPATIENT_CLINIC_OR_DEPARTMENT_OTHER): Payer: Self-pay | Admitting: Obstetrics and Gynecology

## 2020-11-17 NOTE — Lactation Note (Signed)
This note was copied from a baby's chart. Lactation Consultation Note Infant did not wake to bf. Will plan f/u apt to re-challenge at breast.   Patient Name: Allison Boone NOIBB'C Date: 11/17/2020 Reason for consult: Follow-up assessment (latch assist) Age:35 wk.o.  Maternal Data  Pumping frequency: x 8 pumpings  Interventions Interventions: Education;Support pillows;Breast feeding basics reviewed;Assisted with latch;Position options;Adjust position   Consult Status Consult Status: Follow-up Follow-up type: In-patient  Allison Negus, MA IBCLC 11/17/2020, 2:27 PM

## 2020-11-18 ENCOUNTER — Ambulatory Visit: Payer: Self-pay

## 2020-11-18 LAB — SURGICAL PATHOLOGY

## 2020-11-18 NOTE — Lactation Note (Signed)
This note was copied from a baby's chart. Lactation Consultation Note  Patient Name: Boy Belvie Iribe CEYEM'V Date: 11/18/2020 Reason for consult: Follow-up assessment;NICU baby;Mother's request;Late-preterm 34-36.6wks;Other (Comment) (feeding assist) Age:35 wk.o.  Visited with mom of 35 2/7 (adjusted) LPI NICU female, she requested a feeding assist for 2 pm. Baby awake and cueing on arrival, [facility] assisted with latching and positioning.  Baby latched with ease in cross cradle hold to the left breast, but started to slow down after the 5 minutes mark and switched from NS to NNS. Several audible swallows noted upon let down. LC assisted mom to break the latch at the 9 minutes mark when baby fell asleep.  Plan of care:  Encouraged mom to continue pumping, at least 8 times/24 hours Breast massage was also encouraged to fully empty the breast, mom still had some "knots" after this feeding She'll pre-pump before putting baby to breast, since her let down is a bit strong  No other support person in the room. All questions and concerns answered, mom will call again for another feedings assist, she'd like to try the right breast the next time.  Maternal Data   Mom's supply is ANL  Feeding Mother's Current Feeding Choice: Breast Milk Nipple Type: Dr. Levert Feinstein Preemie  LATCH Score Latch: Repeated attempts needed to sustain latch, nipple held in mouth throughout feeding, stimulation needed to elicit sucking reflex. (baby was cueing and awake when we started but he started falling asleep after 5 minutes)  Audible Swallowing: A few with stimulation (only in the beginning of the feeding)  Type of Nipple: Everted at rest and after stimulation  Comfort (Breast/Nipple): Soft / non-tender  Hold (Positioning): Assistance needed to correctly position infant at breast and maintain latch.  LATCH Score: 7   Lactation Tools Discussed/Used Tools: Pump;Flanges Flange Size: 24;27 Breast pump type:  Double-Electric Breast Pump Pump Education: Setup, frequency, and cleaning;Milk Storage Reason for Pumping: LPI in NICU Pumping frequency: q 3 hours Pumped volume: 240 mL (240-270 ml)  Interventions Interventions: Breast feeding basics reviewed;Assisted with latch;Adjust position;Breast compression;DEBP;Education  Discharge    Consult Status Consult Status: Follow-up Follow-up type: In-patient    Sharronda Schweers [name] 11/18/2020, 2:32 PM

## 2020-11-24 ENCOUNTER — Ambulatory Visit: Payer: Self-pay

## 2020-11-24 NOTE — Lactation Note (Signed)
This note was copied from a baby's chart. Lactation Consultation Note Observed very brief but independent and effective latch with audible swallowing for about 1-2 minutes. Baby fell asleep at breast and did not wake with light pestering to continue. I reviewed the normalcy of his behavior at 37+1 and encouraged mother to allow baby to sleep at breast to encourage bf'ing. He will transition to ad lib feeding today.   Mom has normal milk supply.   Patient Name: Allison Boone TDVVO'H Date: 11/24/2020 Reason for consult: Follow-up assessment;Mother's request (observed bf'ing) Age:35 wk.o.  Maternal Data  Q3 average  Feeding Mother's Current Feeding Choice: Breast Milk Nipple Type: Dr. Levert Feinstein Preemie  LATCH Score Latch: Grasps breast easily, tongue down, lips flanged, rhythmical sucking.  Audible Swallowing: Spontaneous and intermittent  Type of Nipple: Everted at rest and after stimulation  Comfort (Breast/Nipple): Soft / non-tender  Hold (Positioning): No assistance needed to correctly position infant at breast.  LATCH Score: 10  Interventions Interventions: Education;Support pillows;Position options;Skin to skin;Adjust position  Consult Status Consult Status: Follow-up Follow-up type: In-patient   [name], MA IBCLC 11/24/2020, 11:32 AM

## 2020-11-30 ENCOUNTER — Encounter: Payer: Medicaid Other | Admitting: Obstetrics and Gynecology

## 2021-01-21 ENCOUNTER — Encounter: Payer: Self-pay | Admitting: Radiology

## 2022-02-17 IMAGING — US US MFM OB FOLLOW-UP
1 series · 13 of 28 positions shown · non-contrast
Comparison: none

[Series 1: us mfm ob follow-up · 13 of 53 slices shown]
[im 2/53]
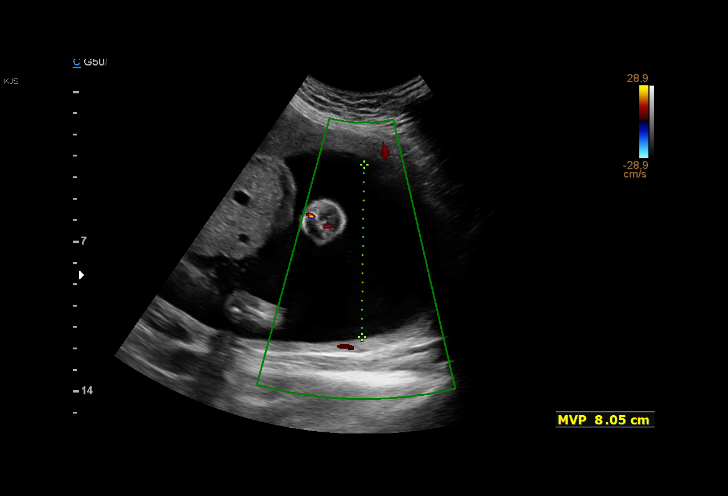
[im 6/53]
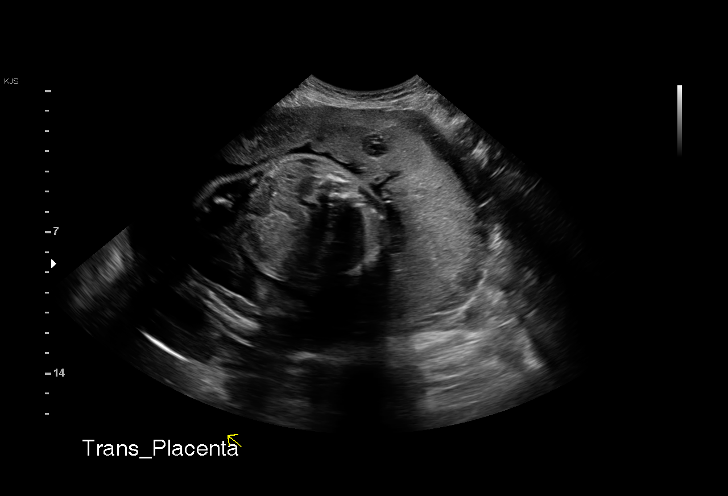
[im 10/53]
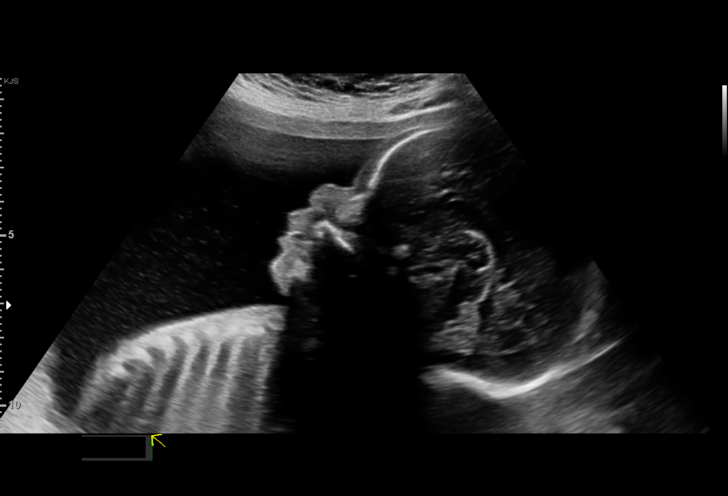
[im 14/53]
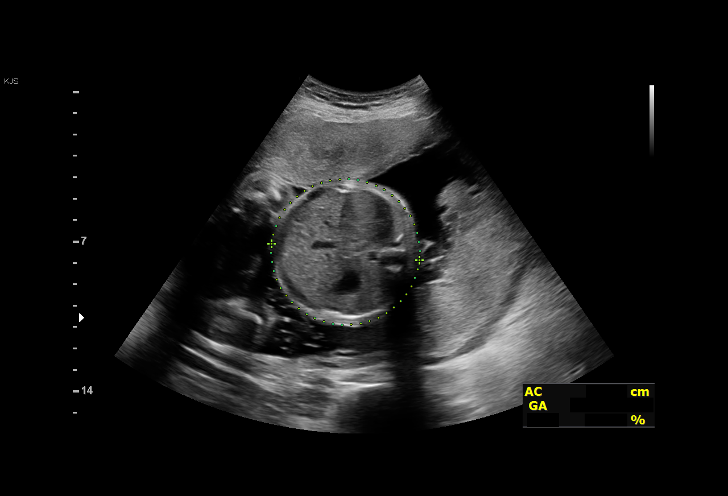
[im 18/53]
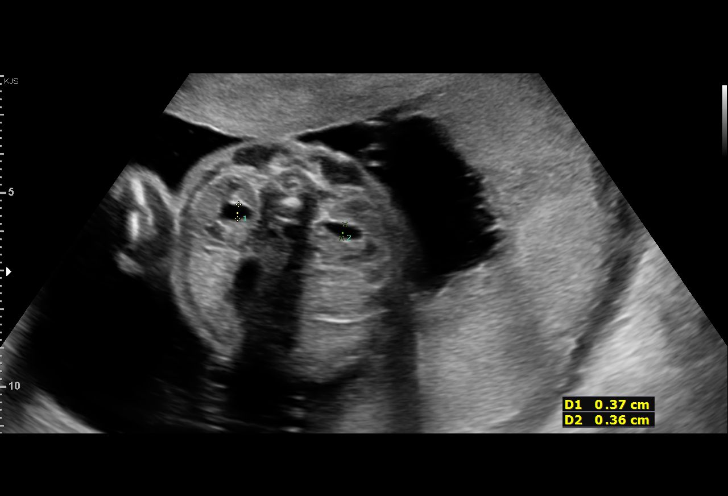
[im 22/53]
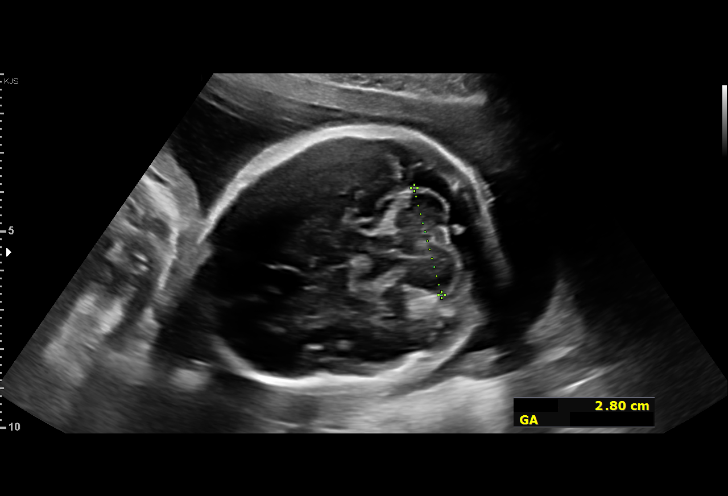
[im 27/53]
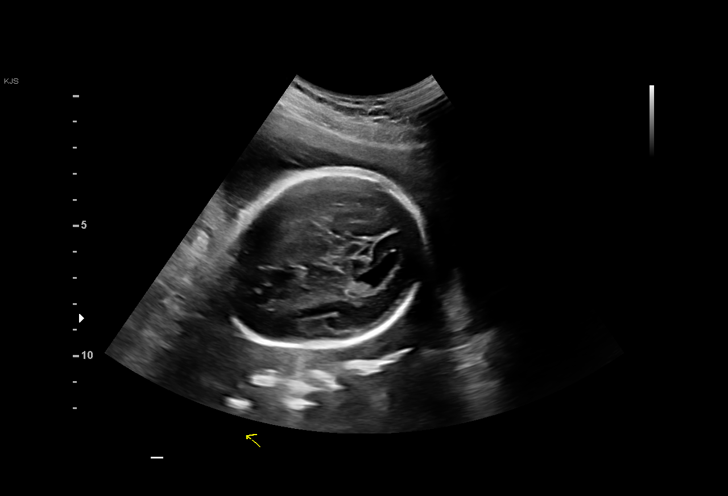
[im 31/53]
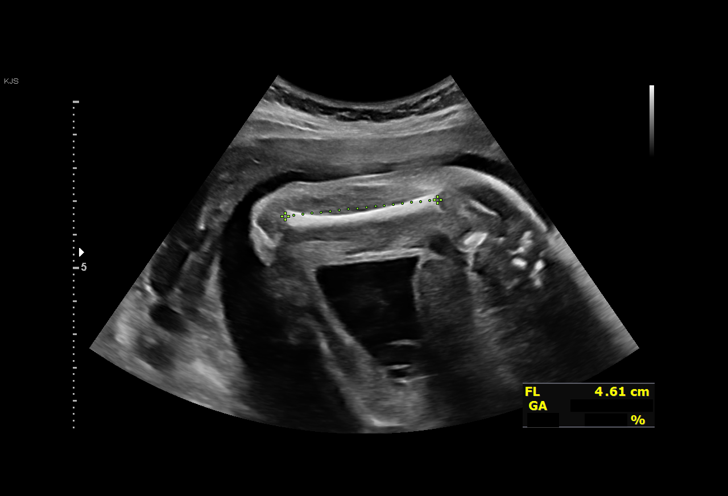
[im 35/53]
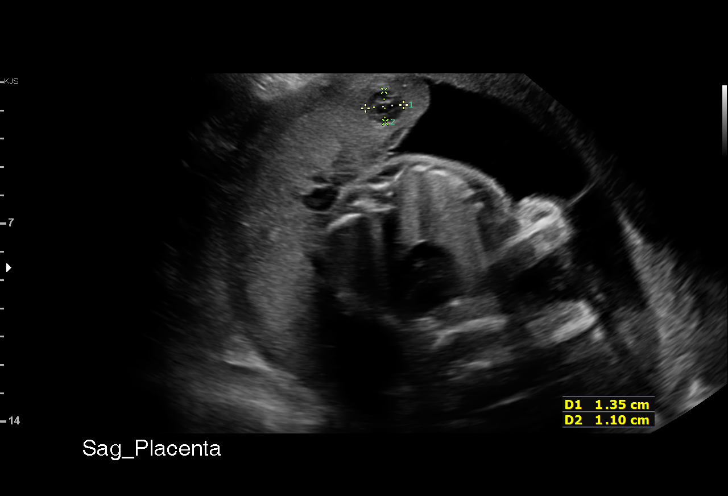
[im 39/53]
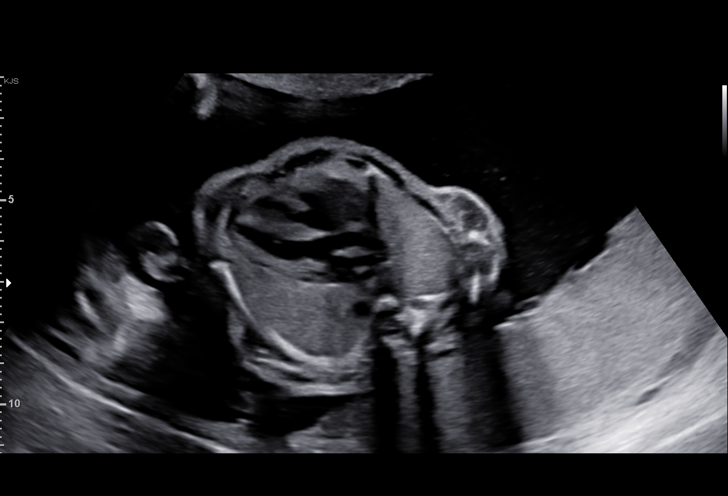
[im 43/53]
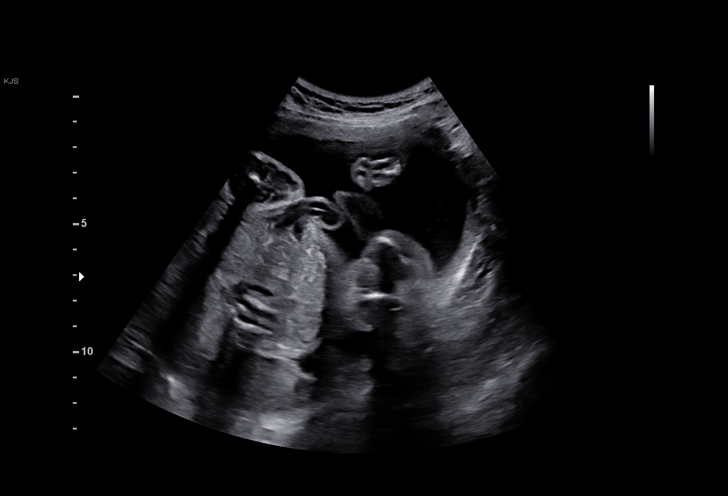
[im 47/53]
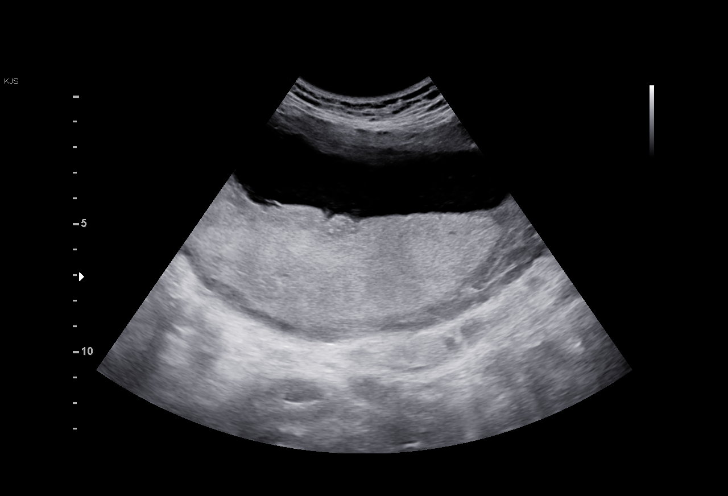
[im 51/53]
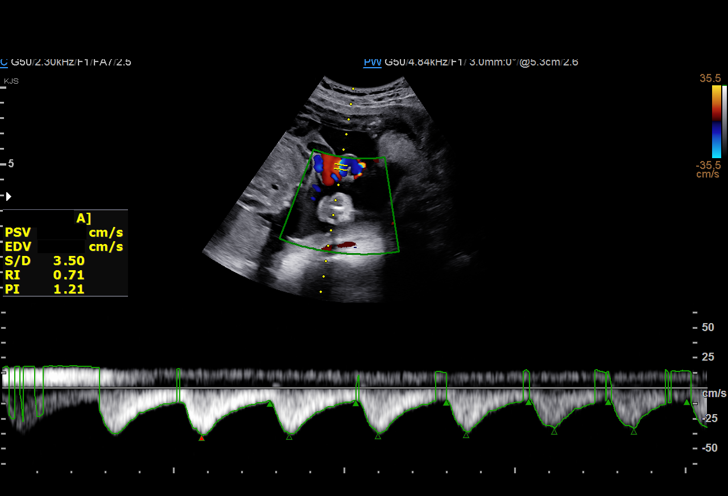

[13 of 28 positions shown; findings below may reference images not displayed]

Indications

 Poor obstetric history: Previous IUFD
 (stillbirth) x 2
 Advanced maternal age multigravida 35+,
 second trimester
 25 weeks gestation of pregnancy
 Poor obstetric history (prior pre-term labor)
 Weeks of gestation of pregnancy not
 specified
 LR NIPS, AFP - neg.
 Abdominal pain in pregnancy
 Encounter for other antenatal screening
 follow-up
Fetal Evaluation

 Num Of Fetuses:         1
 Fetal Heart Rate(bpm):  147
 Cardiac Activity:       Observed
 Presentation:           Cephalic
 Placenta:               Posterior Fundal
 P. Cord Insertion:      Previously Visualized

 Amniotic Fluid
 AFI FV:      Within normal limits

                             Largest Pocket(cm)

 RUQ(cm)
 7
Biometry

 BPD:      63.8  mm     G. Age:  25w 6d         60  %    CI:        72.83   %    70 - 86
                                                         FL/HC:      19.1   %    18.7 -
 HC:      237.7  mm     G. Age:  25w 6d         47  %    HC/AC:      1.11        1.04 -
 AC:      214.5  mm     G. Age:  26w 0d         62  %    FL/BPD:     71.2   %    71 - 87
 FL:       45.4  mm     G. Age:  25w 0d         28  %    FL/AC:      21.2   %    20 - 24
 HUM:      41.8  mm     G. Age:  25w 2d         41  %
 CER:        28  mm     G. Age:  24w 6d         40  %
 LV:        6.4  mm

 Est. FW:     829  gm    1 lb 13 oz      53  %
OB History

 Gravidity:    4         Prem:   3
 Living:       1
Gestational Age

 LMP:           25w 2d        Date:  03/09/20                 EDD:   12/14/20
 U/S Today:     25w 5d                                        EDD:   12/11/20
 Best:          25w 2d     Det. By:  LMP  (03/09/20)          EDD:   12/14/20
Anatomy

 Cranium:               Appears normal         LVOT:                   Previously seen
 Cavum:                 Previously seen        Aortic Arch:            Previously seen
 Ventricles:            Appears normal         Ductal Arch:            Previously seen
 Choroid Plexus:        Previously seen        Diaphragm:              Appears normal
 Cerebellum:            Previously seen        Stomach:                Appears normal, left
                                                                       sided
 Posterior Fossa:       Previously seen        Abdomen:                Previously seen
 Nuchal Fold:           Not applicable (>20    Abdominal Wall:         Previously seen
                        wks GA)
 Face:                  Orbits and profile     Cord Vessels:           Previously seen
                        previously seen
 Lips:                  Previously seen        Kidneys:                Appear normal
 Palate:                Previously seen        Bladder:                Appears normal
 Thoracic:              Previously seen        Spine:                  Previously seen
 Heart:                 Previously seen        Upper Extremities:      Previously seen
 RVOT:                  Previously seen        Lower Extremities:      Previously seen

 Other:  Male gender previously seen. Nasal bone, Heels/feet and open
         hands/5th digits,  Lenses, VC, 3VV and 3VTV prev. visualized.
         Technically difficult due to advanced GA and fetal position.
Cervix Uterus Adnexa

 Cervix
 Not visualized (advanced GA >93wks)

 Uterus
 No abnormality visualized.
 Right Ovary
 Not visualized.

 Left Ovary
 Not visualized.

 Cul De Sac
 No free fluid seen.

 Adnexa
 No abnormality visualized.
Comments

 This patient was seen for a follow up exam due to her prior
 history of two prior fetal demises.  She denies any problems
 since her last exam and reports feeling fetal movements
 throughout the day.
 She was informed that the fetal growth and amniotic fluid
 level appears appropriate for her gestational age.
 Due to her prior poor obstetrical history, a follow-up exam
 was scheduled in 4 weeks.  We will also start weekly
 nonstress tests at that time and continue the nonstress
 tests/BPPs until delivery.

## 2022-03-18 IMAGING — US US MFM OB FOLLOW-UP
1 series · 13 of 28 positions shown · non-contrast
Comparison: none

[Series 1: us mfm ob follow-up · 107 acquisitions, 13 frames shown]
[im 4/107]
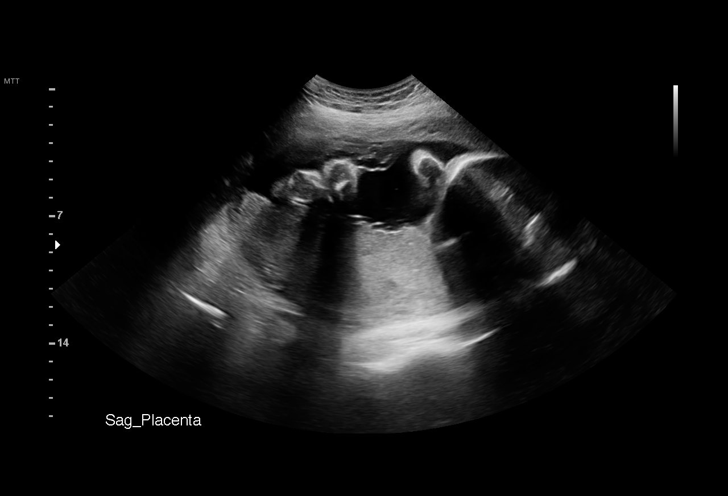
[im 12/107]
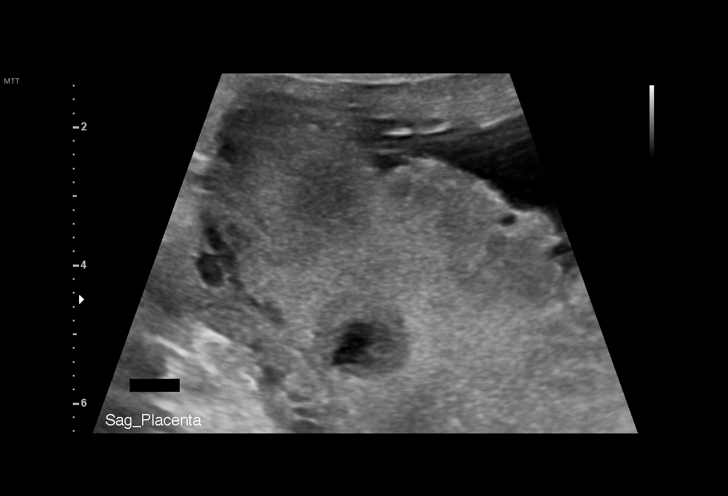
[im 20/107]
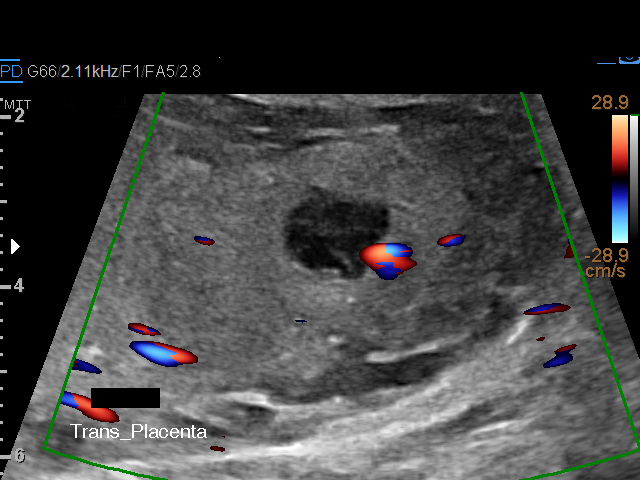
[im 28/107]
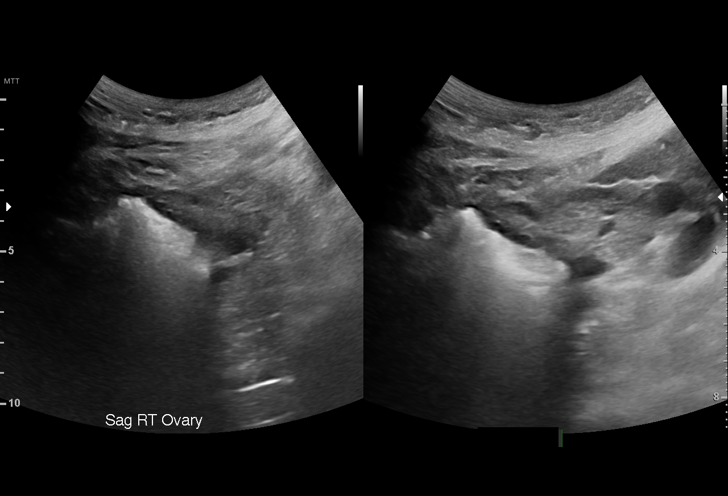
[im 36/107]
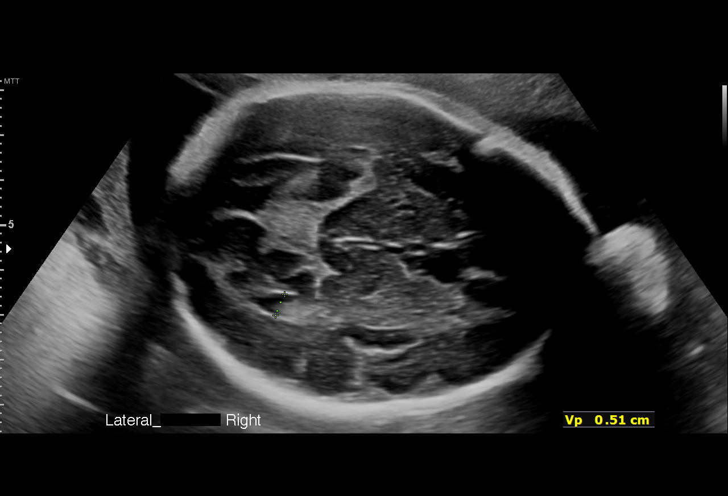
[im 44/107]
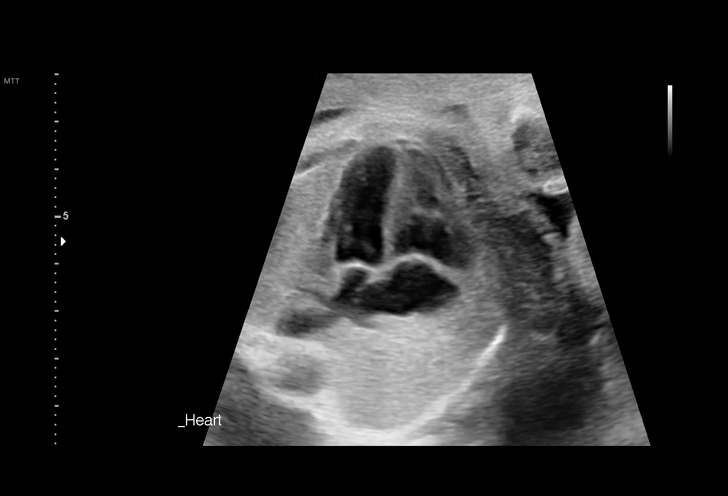
[im 55/107]
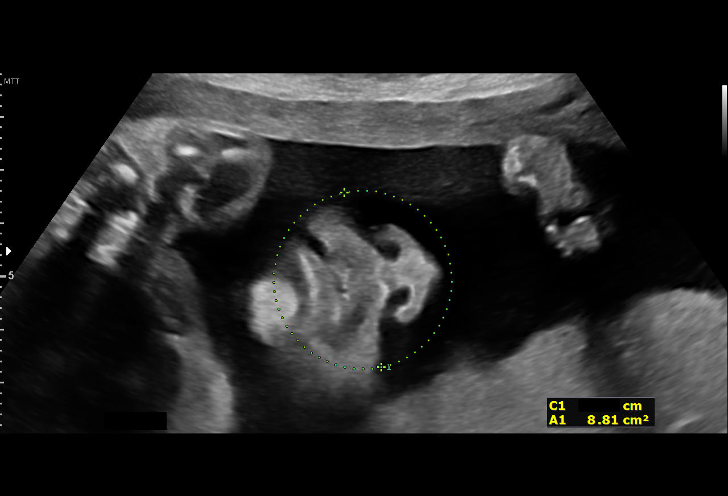
[im 63/107]
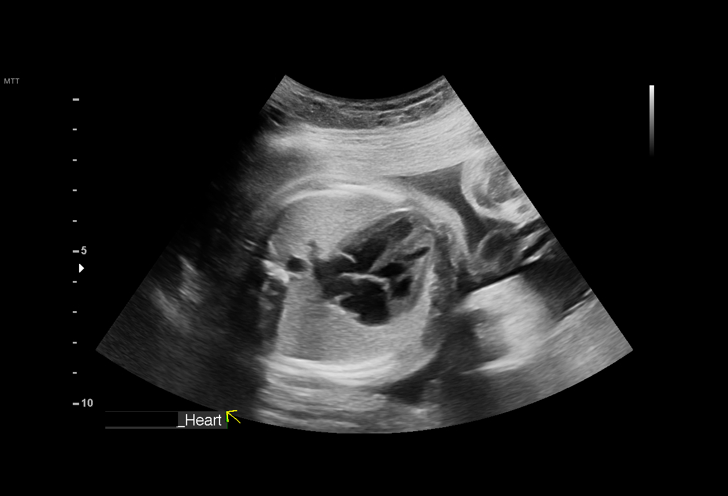
[im 71/107]
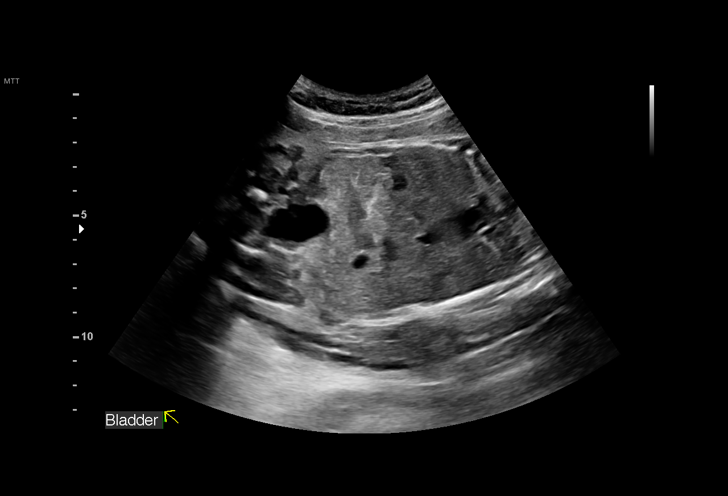
[im 79/107]
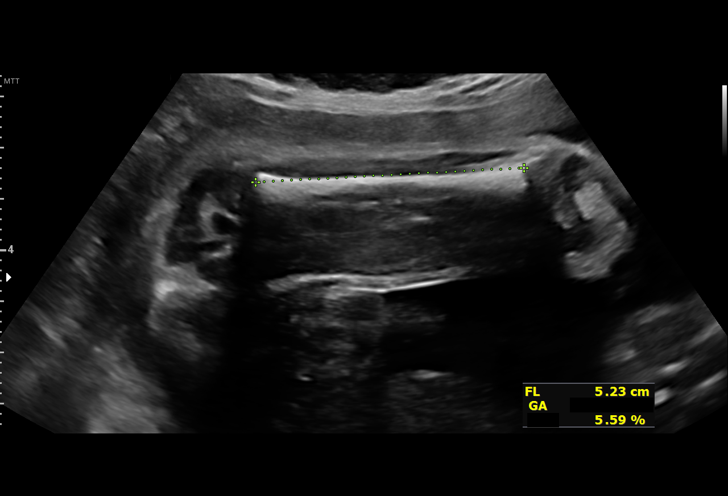
[im 87/107]
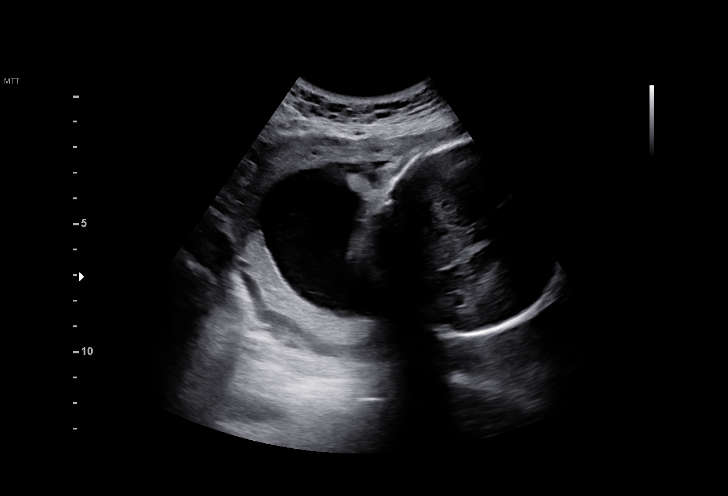
[im 95/107]
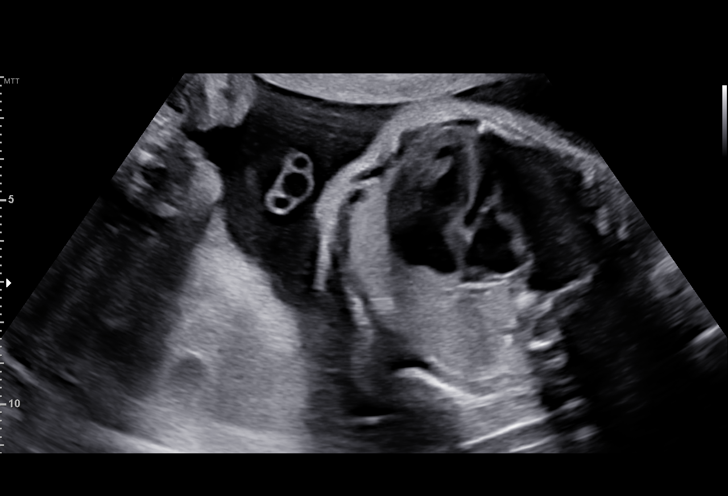
[im 103/107]
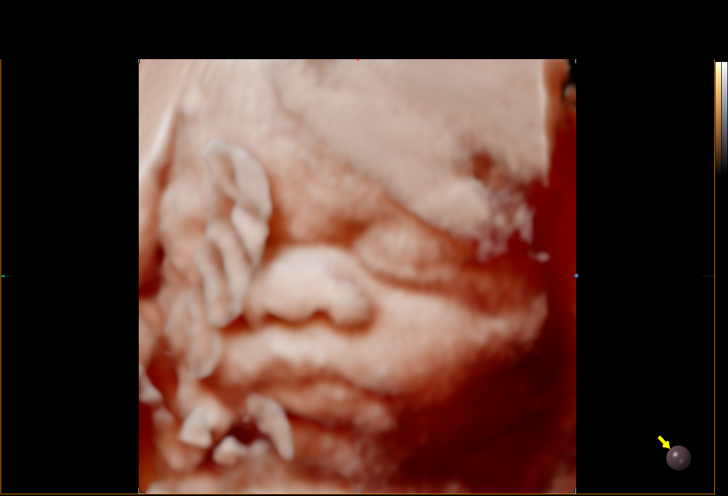

[13 of 28 positions shown; findings below may reference images not displayed]

Indications

 Poor obstetric history: Previous IUFD
 (stillbirth) x 2
 29 weeks gestation of pregnancy
 Advanced maternal age multigravida 35+,
 third trimester
 Poor obstetric history (prior pre-term labor)
 LR NIPS, AFP - neg.
 Encounter for other antenatal screening
 follow-up
Fetal Evaluation

 Num Of Fetuses:         1
 Fetal Heart Rate(bpm):  136
 Cardiac Activity:       Observed
 Presentation:           Cephalic
 Placenta:               Posterior Fundal
 P. Cord Insertion:      Previously Visualized

 Amniotic Fluid
 AFI FV:      Within normal limits

 AFI Sum(cm)     %Tile       Largest Pocket(cm)
 13.8            44

 RUQ(cm)       RLQ(cm)       LUQ(cm)        LLQ(cm)

Biometry

 BPD:      70.3  mm     G. Age:  28w 2d          9  %    CI:        73.08   %    70 - 86
                                                         FL/HC:      20.1   %    19.6 -
 HC:      261.4  mm     G. Age:  28w 3d        3.6  %    HC/AC:      1.04        0.99 -
 AC:      250.6  mm     G. Age:  29w 2d         39  %    FL/BPD:     74.8   %    71 - 87
 FL:       52.6  mm     G. Age:  28w 0d          7  %    FL/AC:      21.0   %    20 - 24
 CER:      36.6  mm     G. Age:  30w 3d         72  %

 LV:        5.1  mm

 Est. FW:    0484  gm    2 lb 13 oz      16  %
OB History

 Gravidity:    4         Prem:   3
 Living:       1
Gestational Age

 LMP:           29w 3d        Date:  03/09/20                 EDD:   12/14/20
 U/S Today:     28w 4d                                        EDD:   12/20/20
 Best:          29w 3d     Det. By:  LMP  (03/09/20)          EDD:   12/14/20
Anatomy

 Cranium:               Appears normal         LVOT:                   Appears normal
 Cavum:                 Appears normal         Aortic Arch:            Appears normal
 Ventricles:            Appears normal         Ductal Arch:            Previously seen
 Choroid Plexus:        Appears normal         Diaphragm:              Appears normal
 Cerebellum:            Appears normal         Stomach:                Appears normal, left
                                                                       sided
 Posterior Fossa:       Previously seen        Abdomen:                Previously seen
 Nuchal Fold:           Not applicable (>20    Abdominal Wall:         Previously seen
                        wks GA)
 Face:                  Orbits and profile     Cord Vessels:           Previously seen
                        previously seen
 Lips:                  Appears normal         Kidneys:                Appear normal
 Palate:                Appears normal         Bladder:                Appears normal
 Thoracic:              Previously seen        Spine:                  Previously seen
 Heart:                 Previously seen        Upper Extremities:      Previously seen
 RVOT:                  Appears normal         Lower Extremities:      Previously seen

 Other:  Male gender previously seen. Nasal bone, Heels/feet and open
         hands/5th digits,  Lenses, VC, 3VV and 3VTV prev. visualized.
         Technically difficult due to advanced GA and fetal position.
Cervix Uterus Adnexa

 Cervix
 Length:           3.83  cm.
 Not visualized (advanced GA >08wks)

 Uterus
 No abnormality visualized.
 Right Ovary
 Not visualized.

 Left Ovary
 Not visualized.

 Cul De Sac
 No free fluid seen.

 Adnexa
 No abnormality visualized.
Comments

 This patient was seen for a follow up exam due to her prior
 history of two prior fetal demises.  The patient's blood
 pressures in our office were 158/90, 158/88, and 145/82.
 She denies any history of hypertension.  She reports feeling
 fetal movements throughout the day and denies any signs or
 symptoms associated with preeclampsia.
 She was informed that the fetal growth and amniotic fluid
 level appears appropriate for her gestational age.
 The patient had a reactive nonstress test for her gestational
 age following today's ultrasound exam.
 Due to her history of prior IUFD's, we will continue weekly
 NSTs/fetal testing until delivery.
 She will return in 1 week for another NST.  We will reassess
 the fetal growth again in 4 weeks.
 Should her blood pressures remain elevated, she may need
 to be started on an antihypertensive medication and to be
 evaluated for preeclampsia.  Preeclampsia precautions were
 discussed today.
